# Patient Record
Sex: Female | Born: 1965 | Race: Black or African American | Hispanic: No | Smoking: Never smoker
Health system: Southern US, Community
[De-identification: ages and names within clinical notes are randomized; demographics above are authoritative.]

## PROBLEM LIST (undated history)

## (undated) DIAGNOSIS — I10 Essential (primary) hypertension: Secondary | ICD-10-CM

## (undated) DIAGNOSIS — E1159 Type 2 diabetes mellitus with other circulatory complications: Secondary | ICD-10-CM

## (undated) DIAGNOSIS — K219 Gastro-esophageal reflux disease without esophagitis: Secondary | ICD-10-CM

## (undated) DIAGNOSIS — R059 Cough, unspecified: Secondary | ICD-10-CM

## (undated) DIAGNOSIS — R05 Cough: Secondary | ICD-10-CM

## (undated) DIAGNOSIS — E119 Type 2 diabetes mellitus without complications: Secondary | ICD-10-CM

## (undated) DIAGNOSIS — E1140 Type 2 diabetes mellitus with diabetic neuropathy, unspecified: Secondary | ICD-10-CM

## (undated) DIAGNOSIS — R911 Solitary pulmonary nodule: Secondary | ICD-10-CM

## (undated) DIAGNOSIS — J45909 Unspecified asthma, uncomplicated: Secondary | ICD-10-CM

## (undated) HISTORY — DX: Essential (primary) hypertension: I10

## (undated) HISTORY — DX: Unspecified asthma, uncomplicated: J45.909

## (undated) HISTORY — DX: Type 2 diabetes mellitus with other circulatory complications: E11.59

## (undated) HISTORY — DX: Cough: R05

## (undated) HISTORY — DX: Cough, unspecified: R05.9

## (undated) HISTORY — DX: Solitary pulmonary nodule: R91.1

## (undated) HISTORY — DX: Type 2 diabetes mellitus without complications: E11.9

## (undated) HISTORY — DX: Gastro-esophageal reflux disease without esophagitis: K21.9

## (undated) HISTORY — DX: Type 2 diabetes mellitus with diabetic neuropathy, unspecified: E11.40

---

## 2007-03-20 ENCOUNTER — Emergency Department (HOSPITAL_COMMUNITY): Admission: EM | Admit: 2007-03-20 | Discharge: 2007-03-20 | Payer: Self-pay | Admitting: *Deleted

## 2007-06-14 ENCOUNTER — Encounter: Admission: RE | Admit: 2007-06-14 | Discharge: 2007-06-14 | Payer: Self-pay | Admitting: Internal Medicine

## 2008-03-03 ENCOUNTER — Ambulatory Visit (HOSPITAL_COMMUNITY): Admission: RE | Admit: 2008-03-03 | Discharge: 2008-03-03 | Payer: Self-pay | Admitting: Family Medicine

## 2008-03-24 ENCOUNTER — Ambulatory Visit (HOSPITAL_COMMUNITY): Admission: RE | Admit: 2008-03-24 | Discharge: 2008-03-24 | Payer: Self-pay | Admitting: Obstetrics & Gynecology

## 2008-08-05 ENCOUNTER — Ambulatory Visit: Payer: Self-pay | Admitting: Family

## 2008-08-05 ENCOUNTER — Inpatient Hospital Stay (HOSPITAL_COMMUNITY): Admission: RE | Admit: 2008-08-05 | Discharge: 2008-08-05 | Payer: Self-pay | Admitting: Obstetrics & Gynecology

## 2008-08-07 ENCOUNTER — Inpatient Hospital Stay (HOSPITAL_COMMUNITY): Admission: AD | Admit: 2008-08-07 | Discharge: 2008-08-10 | Payer: Self-pay | Admitting: Obstetrics & Gynecology

## 2008-08-07 ENCOUNTER — Ambulatory Visit: Payer: Self-pay | Admitting: Obstetrics & Gynecology

## 2008-08-07 ENCOUNTER — Encounter: Payer: Self-pay | Admitting: Obstetrics & Gynecology

## 2009-11-16 ENCOUNTER — Emergency Department (HOSPITAL_COMMUNITY): Admission: EM | Admit: 2009-11-16 | Discharge: 2009-11-16 | Payer: Self-pay | Admitting: Emergency Medicine

## 2009-11-16 LAB — CONVERTED CEMR LAB
ALT: 17 units/L
AST: 19 units/L
Albumin: 3.9 g/dL
BUN: 9 mg/dL
CO2: 26 meq/L
Calcium: 9.4 mg/dL
Chloride: 100 meq/L
Potassium: 4.3 meq/L
RDW: 13.8 %
Total Bilirubin: 0.5 mg/dL
WBC: 9.3 10*3/uL

## 2009-11-29 ENCOUNTER — Ambulatory Visit: Payer: Self-pay | Admitting: Nurse Practitioner

## 2009-11-29 DIAGNOSIS — M25569 Pain in unspecified knee: Secondary | ICD-10-CM

## 2009-11-29 DIAGNOSIS — I10 Essential (primary) hypertension: Secondary | ICD-10-CM

## 2009-11-29 DIAGNOSIS — K219 Gastro-esophageal reflux disease without esophagitis: Secondary | ICD-10-CM | POA: Insufficient documentation

## 2009-11-29 DIAGNOSIS — J00 Acute nasopharyngitis [common cold]: Secondary | ICD-10-CM | POA: Insufficient documentation

## 2009-12-27 ENCOUNTER — Ambulatory Visit: Payer: Self-pay | Admitting: Nurse Practitioner

## 2010-01-03 ENCOUNTER — Ambulatory Visit (HOSPITAL_COMMUNITY): Admission: RE | Admit: 2010-01-03 | Discharge: 2010-01-03 | Payer: Self-pay | Admitting: Nurse Practitioner

## 2010-01-10 ENCOUNTER — Telehealth (INDEPENDENT_AMBULATORY_CARE_PROVIDER_SITE_OTHER): Payer: Self-pay | Admitting: Nurse Practitioner

## 2010-01-31 ENCOUNTER — Ambulatory Visit: Payer: Self-pay | Admitting: Nurse Practitioner

## 2010-02-03 LAB — CONVERTED CEMR LAB
Albumin: 4.4 g/dL (ref 3.5–5.2)
Alkaline Phosphatase: 57 units/L (ref 39–117)
Calcium: 9 mg/dL (ref 8.4–10.5)
Cholesterol: 177 mg/dL (ref 0–200)
Eosinophils Relative: 2 % (ref 0–5)
HCT: 41.5 % (ref 36.0–46.0)
HDL: 38 mg/dL — ABNORMAL LOW (ref >39)
Hemoglobin: 13.3 g/dL (ref 12.0–15.0)
MCV: 89.6 fL (ref 78.0–100.0)
Monocytes Absolute: 0.3 10*3/uL (ref 0.1–1.0)
Neutrophils Relative %: 42 % — ABNORMAL LOW (ref 43–77)
Potassium: 4.2 meq/L (ref 3.5–5.3)
RBC: 4.63 M/uL (ref 3.87–5.11)
RDW: 13.5 % (ref 11.5–15.5)
Sodium: 136 meq/L (ref 135–145)
Total Bilirubin: 0.6 mg/dL (ref 0.3–1.2)
Total CHOL/HDL Ratio: 4.7
Triglycerides: 131 mg/dL (ref <150)
VLDL: 26 mg/dL (ref 0–40)
WBC: 6.6 10*3/uL (ref 4.0–10.5)

## 2010-03-15 ENCOUNTER — Ambulatory Visit: Payer: Self-pay | Admitting: Nurse Practitioner

## 2010-03-15 DIAGNOSIS — K5900 Constipation, unspecified: Secondary | ICD-10-CM | POA: Insufficient documentation

## 2010-03-15 LAB — CONVERTED CEMR LAB
Blood in Urine, dipstick: NEGATIVE
CO2: 24 meq/L (ref 19–32)
Chloride: 103 meq/L (ref 96–112)
Creatinine, Ser: 0.7 mg/dL (ref 0.40–1.20)
Glucose, Bld: 100 mg/dL — ABNORMAL HIGH (ref 70–99)
KOH Prep: NEGATIVE
Ketones, urine, test strip: NEGATIVE
Nitrite: NEGATIVE
Protein, U semiquant: NEGATIVE
Specific Gravity, Urine: >=1.03
Urobilinogen, UA: 0.2
WBC Urine, dipstick: NEGATIVE
pH: 6

## 2010-03-16 LAB — CONVERTED CEMR LAB: OCCULT 1: NEGATIVE

## 2010-03-18 ENCOUNTER — Encounter (INDEPENDENT_AMBULATORY_CARE_PROVIDER_SITE_OTHER): Payer: Self-pay | Admitting: Nurse Practitioner

## 2010-03-18 LAB — CONVERTED CEMR LAB: Pap Smear: NEGATIVE

## 2010-03-22 ENCOUNTER — Ambulatory Visit (HOSPITAL_COMMUNITY): Admission: RE | Admit: 2010-03-22 | Discharge: 2010-03-22 | Payer: Self-pay | Admitting: Internal Medicine

## 2010-05-09 ENCOUNTER — Telehealth (INDEPENDENT_AMBULATORY_CARE_PROVIDER_SITE_OTHER): Payer: Self-pay | Admitting: *Deleted

## 2010-05-10 ENCOUNTER — Encounter (INDEPENDENT_AMBULATORY_CARE_PROVIDER_SITE_OTHER): Payer: Self-pay | Admitting: Nurse Practitioner

## 2010-06-14 ENCOUNTER — Ambulatory Visit: Payer: Self-pay | Admitting: Nurse Practitioner

## 2010-10-13 ENCOUNTER — Ambulatory Visit: Payer: Self-pay | Admitting: Nurse Practitioner

## 2010-11-06 ENCOUNTER — Encounter: Payer: Self-pay | Admitting: Internal Medicine

## 2010-11-06 ENCOUNTER — Encounter: Payer: Self-pay | Admitting: Family Medicine

## 2010-11-15 NOTE — Progress Notes (Signed)
Summary: Office Visit//DEPRESSION SCREENING  Office Visit//DEPRESSION SCREENING   Imported By: Arta Bruce 04/25/2010 15:00:39  _____________________________________________________________________  External Attachment:    Type:   Image     Comment:   External Document

## 2010-11-15 NOTE — Progress Notes (Signed)
Summary: X-ray results  Phone Note Outgoing Call   Summary of Call: notify pt that knee x-rays were reviewed. she has mild joint space narrowing or arthritis. continue the tramadol as needed for pain Initial call taken by: Lehman Prom FNP,  January 10, 2010 4:31 PM  Follow-up for Phone Call        left message with person that answered the phone to have pt return call to the office. Levon Hedger  January 11, 2010 12:07 PM  spoke with pt's son and he informed pt of result and for pt to continue taking the tramadol. Follow-up by: Levon Hedger,  January 12, 2010 12:53 PM  New Problems: KNEE PAIN [phone])   New Problems: KNEE PAIN (ICD-719.46)

## 2010-11-15 NOTE — Assessment & Plan Note (Signed)
Summary: Complete Physical Exam   Vital Signs:  Patient profile:   45 year old female Menstrual status:  regular LMP:     01/22/2010 Weight:      211 pounds BMI:     37.51 BSA:     1.98 Temp:     97.4 degrees F oral Pulse rate:   90 / minute Pulse rhythm:   regular Resp:     20 per minute BP sitting:   127 / 84  (left arm) Cuff size:   large  Vitals Entered By: Levon Hedger (Mar 15, 2010 9:11 AM) CC: CPP, Hypertension Management, Abdominal Pain Is Patient Diabetic? No Pain Assessment Patient in pain? yes     Location: knee,chest,stomach Onset of pain  Chronic  Does patient need assistance? Functional Status Self care Ambulation Normal LMP (date): 01/22/2010     Enter LMP: 01/22/2010  Immunization History:  MMR Immunization History:    MMR # 1:  historical (09/28/2006)  Tetanus/Td Immunization History:    Tetanus/Td:  historical (04/04/2007)   CC:  CPP, Hypertension Management, and Abdominal Pain.  History of Present Illness:  Pt into the office for a complete physical exam  PAP - no Pap smear since she has been in the [place] which is 2 years ago  Mammogram - no mammogram in the past year  Optho - eye exam was less than 1 year ago and pt did get glasses but pt does not wear because she thinks the medicine in the glasses is too strong  Dental - last exam was 2 years ago upon arrival to the [place]  son present today in office and is interpreting for the pt  Dyspepsia History:      She has no alarm features of dyspepsia including no history of melena, hematochezia, dysphagia, persistent vomiting, or involuntary weight loss > 5%.  There is a prior history of GERD.  The patient does not have a prior history of documented ulcer disease.  The dominant symptom is heartburn or acid reflux.  An H-2 blocker medication is not currently being taken.  She has a history of a positive H. Pylori serology.  No previous upper endoscopy has been done.    Hypertension History:     She denies headache, chest pain, and palpitations.  She notes no problems with any antihypertensive medication side effects.        Positive major cardiovascular risk factors include hypertension.  Negative major cardiovascular risk factors include female age less than 26 years old, no history of diabetes or hyperlipidemia, and non-tobacco-user status.        Further assessment for target organ damage reveals no history of ASHD, cardiac end-organ damage (CHF/LVH), stroke/TIA, peripheral vascular disease, renal insufficiency, or hypertensive retinopathy.    Habits & Providers  Alcohol-Tobacco-Diet     Alcohol drinks/day: 0     Tobacco Status: never  Exercise-Depression-Behavior     Does Patient Exercise: no     Depression Counseling: not indicated; screening negative for depression     Drug Use: never  Medications Prior to Update: 1)  Omeprazole 20 Mg Cpdr (Omeprazole) .... One Tablet By Mouth Two Times A Day Before Breakfast and Before Dinner 2)  Tramadol Hcl 50 Mg Tabs (Tramadol Hcl) .... One Tablet By Mouth Two Times A Day As Needed For Pain 3)  Lisinopril 10 Mg Tabs (Lisinopril) .... One Tablet By Mouth Daily For Blood Pressure 4)  Amoxicillin 500 Mg Caps (Amoxicillin) .... 2 Capsules By Mouth Two Times  A Day For Infection  Allergies (verified): 1)  ! Bactrim  Review of Systems General:  Denies fever. Eyes:  Denies blurring. ENT:  Denies earache. CV:  Denies chest pain or discomfort. Resp:  Denies cough. GI:  Complains of constipation; denies abdominal pain, nausea, and vomiting; pt has finished medications ordered for H.pylori. Abd symptoms have improved. GU:  Denies discharge. MS:  Complains of joint pain; bil knee pain. Derm:  Denies rash. Neuro:  Denies headaches. Psych:  Denies anxiety and depression.  Physical Exam  General:  alert.   Head:  normocephalic.   Eyes:  pupils round.   Ears:  R ear normal and L ear normal.  bil TM visible with minimal cerumen Nose:   no nasal discharge.   Mouth:  pharynx pink and moist.   Neck:  supple.   Chest Wall:  no mass.   Breasts:  no abnormal thickening.   Lungs:  normal breath sounds.   Heart:  normal rate and regular rhythm.   Abdomen:  soft, non-tender, and normal bowel sounds.   Rectal:  no external abnormalities.   Msk:  normal ROM.   Pulses:  R radial normal and L radial normal.   Extremities:  no edema Neurologic:  alert & oriented X3 and gait normal.   Skin:  color normal.   Psych:  Oriented X3.    Pelvic Exam  Vulva:      normal appearance.   Urethra and Bladder:      Urethra--no discharge.   Vagina:      physiologic discharge.   Cervix:      unable to visualize Uterus:      smooth.   Adnexa:      nontender bilaterally.   Rectum:      normal, heme negative stool.      Impression & Recommendations:  Problem # 1:  ROUTINE GYNECOLOGICAL EXAMINATION (ICD-V72.31) labs reviewed from previous visit guaiac negative PAP done tdap up to date rec optho and dental exam Orders: Hemoccult Guaiac-1 spec.(in office) (82270) KOH/ WET Mount [phone]) Pap Smear, Thin Prep ( Collection of) (Q0091) T- GC Chlamydia (29562)  Problem # 2:  UNSPECIFIED BREAST SCREENING (ICD-V76.10) discussed self breast exam Orders: Mammogram (Screening) (Mammo)  Problem # 3:  HYPERTENSION, BENIGN ESSENTIAL (ICD-401.1) stable reviewed DASH diet Her updated medication list for this problem includes:    Lisinopril 10 Mg Tabs (Lisinopril) ..... One tablet by mouth daily for blood pressure  Orders: UA Dipstick w/o Micro (manual) (13086) EKG w/ Interpretation (93000) T-Urine Microalbumin w/creat. ratio ([phone]) T-Basic Metabolic Panel (32440-10272)  Problem # 4:  GASTROESOPHAGEAL REFLUX DISEASE (ICD-530.81)  Her updated medication list for this problem includes:    Omeprazole 20 Mg Cpdr (Omeprazole) ..... One tablet by mouth two times a day before breakfast and before dinner  Problem # 5:   CONSTIPATION (ICD-564.00) advised pt add fiber to her diet may start a stool softner  Complete Medication List: 1)  Omeprazole 20 Mg Cpdr (Omeprazole) .... One tablet by mouth two times a day before breakfast and before dinner 2)  Tramadol Hcl 50 Mg Tabs (Tramadol hcl) .... One tablet by mouth two times a day as needed for pain 3)  Lisinopril 10 Mg Tabs (Lisinopril) .... One tablet by mouth daily for blood pressure  Dyspepsia Assessment/Plan:  Step Therapy: GERD Treatment Protocols:    Step-1: failed    Step-2: started  Hypertension Assessment/Plan:      The patient's hypertensive risk group is category A:  No risk factors and no target organ damage.  Her calculated 10 year risk of coronary heart disease is 6 %.  Today's blood pressure is 127/84.  Her blood pressure goal is < 140/90.    Patient Instructions: 1)  Follow up in 3 months for high blood pressure 2)  Colace 100mg  by mouth two times a day to help soften stools 3)  You will be informed of any abnormal results Prescriptions: TRAMADOL HCL 50 MG TABS (TRAMADOL HCL) One tablet by mouth two times a day as needed for pain  #50 x 1   Entered and Authorized by:   Lehman Prom FNP   Signed by:   Lehman Prom FNP on 03/15/2010   Method used:   Print then Give to Patient   RxID:   [id number]   Laboratory Results   Urine Tests  Date/Time Received: Mar 15, 2010 9:41 AM   Routine Urinalysis   Color: lt. yellow Appearance: Clear Glucose: negative   (Normal Range: Negative) Bilirubin: negative   (Normal Range: Negative) Ketone: negative   (Normal Range: Negative) Spec. Gravity: >=1.030   (Normal Range: 1.003-1.035) Blood: negative   (Normal Range: Negative) pH: 6.0   (Normal Range: 5.0-8.0) Protein: negative   (Normal Range: Negative) Urobilinogen: 0.2   (Normal Range: 0-1) Nitrite: negative   (Normal Range: Negative) Leukocyte Esterace: negative   (Normal Range: Negative)    Date/Time Received: March 16, 2010 4:44 PM   Wet Mount/KOH Source: vaginal  WBC/hpf: 1-5 Bacteria/hpf: 1+ Clue cells/hpf: none Yeast/hpf: none Trichomonas/hpf: none  Stool - Occult Blood Hemmoccult #1: negative Date: 03/16/2010    Laboratory Results   Urine Tests    Routine Urinalysis   Color: lt. yellow Appearance: Clear Glucose: negative   (Normal Range: Negative) Bilirubin: negative   (Normal Range: Negative) Ketone: negative   (Normal Range: Negative) Spec. Gravity: >=1.030   (Normal Range: 1.003-1.035) Blood: negative   (Normal Range: Negative) pH: 6.0   (Normal Range: 5.0-8.0) Protein: negative   (Normal Range: Negative) Urobilinogen: 0.2   (Normal Range: 0-1) Nitrite: negative   (Normal Range: Negative) Leukocyte Esterace: negative   (Normal Range: Negative)      Wet Mount Wet Mount KOH: Negative    Prevention & Chronic Care Immunizations   Influenza vaccine: Not documented    Tetanus booster: 04/04/2007: Historical    Pneumococcal vaccine: Not documented  Other Screening   Pap smear: Not documented    Mammogram: Not documented   Smoking status: never  (03/15/2010)  Lipids   Total Cholesterol: 177  (01/31/2010)   LDL: 113  (01/31/2010)   LDL Direct: Not documented   HDL: 38  (01/31/2010)   Triglycerides: 131  (01/31/2010)  Hypertension   Last Blood Pressure: 127 / 84  (03/15/2010)   Serum creatinine: 0.79  (01/31/2010)   Serum potassium 4.2  (01/31/2010)  Self-Management Support :    Hypertension self-management support: Not documented

## 2010-11-15 NOTE — Assessment & Plan Note (Signed)
Summary: HTN/Knee pain   Vital Signs:  Patient profile:   45 year old female Menstrual status:  regular LMP:     05/29/2010 Height:      63 inches Weight:      207 pounds BMI:     36.80 Temp:     97.9 degrees F oral Pulse rate:   88 / minute Pulse rhythm:   regular Resp:     18 per minute BP sitting:   122 / 80  (left arm) Cuff size:   large  Vitals Entered By: [place] Shannon (June 14, 2010 8:48 AM)  Nutrition Counseling: Patient's BMI is greater than 25 and therefore counseled on weight management options. CC: 3 month follow-up...pt is having problem in her legs, knees, and head , Hypertension Management Is Patient Diabetic? No Pain Assessment Patient in pain? no       Does patient need assistance? Functional Status Self care Ambulation Normal LMP (date): 05/29/2010     Enter LMP: 05/29/2010 Last PAP Result  Specimen Adequacy: Satisfactory for evaluation.   Interpretation/Result:Negative for intraepithelial Lesion or Malignancy.      CC:  3 month follow-up...pt is having problem in her legs, knees, and head , and Hypertension Management.  History of Present Illness:  Pt into the office for f/u on blood pressure.  Pt does not have her medication with her into the office today. 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  Language line used - Swahili **Pt has also been [facility] which is the office listed on her medicaid card**  Hypertension History:      She denies headache, chest pain, and palpitations.  She notes no problems with any antihypertensive medication side effects.  completed supply of blood pressure medications 2 days ago and presents today for a refill.        Positive major cardiovascular risk factors include hypertension.  Negative major cardiovascular risk factors include female age less than 73 years old, no history  of diabetes or hyperlipidemia, and non-tobacco-user status.        Further assessment for target organ damage reveals no history of ASHD, cardiac end-organ damage (CHF/LVH), stroke/TIA, peripheral vascular disease, renal insufficiency, or hypertensive retinopathy.     Current Medications (verified): 1)  Omeprazole 20 Mg Cpdr (Omeprazole) .... One Tablet By Mouth Two Times A Day Before Breakfast and Before Dinner 2)  Tramadol Hcl 50 Mg Tabs (Tramadol Hcl) .... One Tablet By Mouth Two Times A Day As Needed For Pain 3)  Lisinopril 10 Mg Tabs (Lisinopril) .... One Tablet By Mouth Daily For Blood Pressure  Allergies (verified): 1)  ! Bactrim  Review of Systems Eyes:  Complains of blurring; last eye exam was 2 years ago.  she has glasses but when she puts them on her vision is blurred and she gets dizzy.. CV:  Denies chest pain or discomfort. Resp:  Denies cough. GI:  Complains of constipation and indigestion; denies abdominal pain, nausea, and vomiting; still with constipation at times.  . MS:  Complains of joint pain; knee pain - x-rays done earlier this year. pain worse upon waking in the morning.  gets some better during the day but then sometimes at night the pain gets worse.  Physical Exam  General:  alert.   Head:  normocephalic.   Lungs:  normal breath sounds.   Heart:  normal rate and regular rhythm.   Abdomen:  non-tender.   Msk:  up to the exam table Neurologic:  alert & oriented X3.     Knee Exam  General:    obese.    Knee Exam:    Right:    Inspection:  Abnormal    Palpation:  Normal    Stability:  stable    Tenderness:  no    Swelling:  diffuse    Erythema:  no    arthritic changes    Left:    Inspection:  Abnormal    Palpation:  Normal    Stability:  stable    Tenderness:  no    Swelling:  diffuse    Erythema:  no    arthritic changes   Impression & Recommendations:  Problem # 1:  HYPERTENSION, BENIGN ESSENTIAL (ICD-401.1) BP stable will restart  medications DASH diet reviewed with pt Her updated medication list for this problem includes:    Lisinopril-hydrochlorothiazide 10-12.5 Mg Tabs (Lisinopril-hydrochlorothiazide) ..... One tablet by mouth daily for blood pressure  Problem # 2:  KNEE PAIN (ICD-719.46) x-rays done earlier this year Her updated medication list for this problem includes:    Tramadol Hcl 50 Mg Tabs (Tramadol hcl) ..... One tablet by mouth two times a day as needed for pain    Feldene 20 Mg Caps (Piroxicam) ..... One tablet by mouth daily for joints  Problem # 3:  GASTROESOPHAGEAL REFLUX DISEASE (ICD-530.81) symptoms improved will start anti-inflammatory but will monitor as this may increase GI problems Her updated medication list for this problem includes:    Nexium 40 Mg Cpdr (Esomeprazole magnesium) ..... One tablet by mouth daily for stomach  Problem # 4:  CONSTIPATION (ICD-564.00) continued to encourage pt to add more fiber to her diet  fiber one samples given  Complete Medication List: 1)  Nexium 40 Mg Cpdr (Esomeprazole magnesium) .... One tablet by mouth daily for stomach 2)  Tramadol Hcl 50 Mg Tabs (Tramadol hcl) .... One tablet by mouth two times a day as needed for pain 3)  Lisinopril-hydrochlorothiazide 10-12.5 Mg Tabs (Lisinopril-hydrochlorothiazide) .... One tablet by mouth daily for blood pressure 4)  Feldene 20 Mg Caps (Piroxicam) .... One tablet by mouth daily for joints  Hypertension Assessment/Plan:      The patient's hypertensive risk group is category A: No risk factors and no target organ damage.  Her calculated 10 year risk of coronary heart disease is 6 %.  Today's blood pressure is 122/80.  Her blood pressure goal is < 140/90.  Patient Instructions: 1)  Follow up in this office in December 2011 or sooner if necessary with n.martin,fnp for high blood pressure and leg pain. 2)  Flu vaccine will be due then Prescriptions: NEXIUM 40 MG CPDR (ESOMEPRAZOLE MAGNESIUM) One tablet by mouth  daily for stomach  #30 x 5   Entered and Authorized by:   Lehman Prom FNP   Signed by:   Lehman Prom FNP on 06/14/2010   Method used:   Print then Give to Patient   RxID:   [id number] TRAMADOL HCL 50 MG TABS (TRAMADOL HCL) One tablet by mouth two times a day as needed for pain  #50 x 1   Entered and Authorized by:   Lehman Prom FNP   Signed by:   Lehman Prom FNP on 06/14/2010   Method used:   Print then Give to Patient   RxID:   [id number] FELDENE 20 MG CAPS (PIROXICAM) One tablet by mouth  daily for joints  #30 x 5   Entered and Authorized by:   Lehman Prom FNP   Signed by:   Lehman Prom FNP on 06/14/2010   Method used:   Print then Give to Patient   RxID:   [id number] LISINOPRIL-HYDROCHLOROTHIAZIDE 10-12.5 MG TABS (LISINOPRIL-HYDROCHLOROTHIAZIDE) One tablet by mouth daily for blood pressure  #30 x 5   Entered and Authorized by:   Lehman Prom FNP   Signed by:   Lehman Prom FNP on 06/14/2010   Method used:   Print then Give to Patient   RxID:   [id number]

## 2010-11-15 NOTE — Letter (Signed)
Summary: *HSN Results Follow up  [facility]  [address] [place], [place] [address]   Phone: [phone]  Fax: [phone]      03/18/2010   KAMESHIA MADRUGA [address] [place], [place]  [address]  [place]   Dear  Ms. Lamari SINZINKAYO,                            ____S.Drinkard,FNP   ____D. Gore,FNP       ____B. McPherson,MD   ____V. Rankins,MD    ____E. Mulberry,MD    __X__N. Daphine Deutscher, FNP  ____D. Reche Dixon, MD    ____K. Philipp Deputy, MD    ____Other     This letter is to inform you that your recent test(s):  ___X____Pap Smear    ___X____Lab Test     _______X-ray    ___X____ is within acceptable limits  _______ requires a medication change  _______ requires a follow-up lab visit  _______ requires a follow-up visit with your provider   Comments: Labs and pap smear are normal.       _________________________________________________________ If you have any questions, please contact our office [phone].                    Sincerely,    Lehman Prom FNP [facility]

## 2010-11-15 NOTE — Assessment & Plan Note (Signed)
Summary: F/u x-rays & GERD   Vital Signs:  Patient profile:   45 year old female Menstrual status:  regular Weight:      211.7 pounds BMI:     37.64 BSA:     1.98 Temp:     97.5 degrees F oral Pulse rate:   85 / minute Pulse rhythm:   regular Resp:     20 per minute BP sitting:   142 / 113  (left arm) Cuff size:   regular  Vitals Entered By: Levon Hedger (January 31, 2010 9:30 AM) CC: Abdominal Pain, Hypertension Management  Does patient need assistance? Functional Status Self care Ambulation Normal   CC:  Abdominal Pain and Hypertension Management.  History of Present Illness:  Pt into the office for 1 month f/u on abdominal pain  1.  Abdominal pain - pt was started on omeprazole during the previous visit. She was also educated on what foods to avoid.  Pt admits that omeprazole is doing better to improve symptoms but she completed her supply of medications on yesterday.  She presents today with the medication bottle and she has 3 refills on the medications  2.  Knee pain - x-rays done following the last visit. Findings - Mild medial joint compartment narrowing and osteophytosis. pt was stared on tramadol and is taking as needed   3.  Elevated blood pressure - elevated on previous visits but no meds given She is already monitoring sodium in her diet  CPE - last done during pregnancy.  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  The patient does not have a prior history of documented ulcer disease.  The dominant symptom is heartburn or acid reflux.  An H-2 blocker medication is not currently being taken.  No previous upper endoscopy has been done.    Hypertension History:  She denies headache, chest pain, and palpitations.  No current medications.        Positive major cardiovascular risk factors include hypertension.  Negative major cardiovascular risk factors include female age less than 53 years old and non-tobacco-user status.      Habits & Providers  Alcohol-Tobacco-Diet     Alcohol drinks/day: 0     Tobacco Status: never  Exercise-Depression-Behavior     Does Patient Exercise: no     Have you felt down or hopeless? no     Have you felt little pleasure in things? no     Depression Counseling: not indicated; screening negative for depression     Drug Use: never  Allergies: 1)  ! Bactrim  Social History: Does Patient Exercise:  no  Review of Systems CV:  Denies chest pain or discomfort. Resp:  Denies cough. GI:  Denies abdominal pain, nausea, and vomiting. MS:  Complains of joint pain; bil knee pain.  Physical Exam  General:  alert.   Head:  normocephalic.   Lungs:  normal breath sounds.   Heart:  normal rate and regular rhythm.   Msk:  normal ROM.   Neurologic:  alert & oriented X3.   Skin:  color normal.   Psych:  Oriented X3.  Impression & Recommendations:  Problem # 1:  GASTROESOPHAGEAL REFLUX DISEASE (ICD-530.81)  symptoms improved with medications and diet  will check h.pylori today Her updated medication list for this problem includes:    Omeprazole 20 Mg Cpdr (Omeprazole) ..... One tablet by mouth two times a day before breakfast and before dinner  Orders: T-H Pylori Antibody IgM ([id number])  Problem # 2:  KNEE PAIN (ICD-719.46) x-ray results reviewed with pt from previous visit pt also advised to increase her exercise as a means to lose weight  note written for pt - advised her that current symtpoms should NOT limit her from going to school Her updated medication list for this problem includes:    Tramadol Hcl 50 Mg Tabs (Tramadol hcl) ..... One tablet by mouth two times a day as needed for pain  Problem #  3:  HYPERTENSION, BENIGN ESSENTIAL (ICD-401.1)  BP is still elevated. will need to start medications Her updated medication list for this problem includes:    Lisinopril 10 Mg Tabs (Lisinopril) ..... One tablet by mouth daily for blood pressure  Orders: T-Lipid Profile ([id number]) T-Comprehensive Metabolic Panel [phone]) T-CBC w/Diff ([id number]) T-TSH (28413-24401) Rapid HIV  (02725)  Complete Medication List: 1)  Omeprazole 20 Mg Cpdr (Omeprazole) .... One tablet by mouth two times a day before breakfast and before dinner 2)  Tramadol Hcl 50 Mg Tabs (Tramadol hcl) .... One tablet by mouth two times a day as needed for pain 3)  Lisinopril 10 Mg Tabs (Lisinopril) .... One tablet by mouth daily for blood pressure  Dyspepsia Assessment/Plan:  Step Therapy: GERD Treatment Protocols:    Step-1: failed    Step-2: started  Hypertension Assessment/Plan:      The patient's hypertensive risk group is category A: No risk factors and no target organ damage.  Today's blood pressure is 142/113.  Her blood pressure goal is < 140/90.  Patient Instructions: 1)  Schedule an appointment in 4-6 weeks for a complete physical exam. 2)  will need bmp. 3)  You will need PAP, Mammogram, EKG, u/a and  4)  Blood pressure - elevated today.  you will need to start blood pressure medication - lisinopril 10mg  by mouth daily Prescriptions: TRAMADOL HCL 50 MG TABS (TRAMADOL HCL) One tablet by mouth two times a day as needed for pain  #50 x 0   Entered and Authorized by:   Lehman Prom FNP   Signed by:   Lehman Prom FNP on 01/31/2010   Method used:   Print then Give to Patient   RxID:   [id number] LISINOPRIL 10 MG TABS (LISINOPRIL) One tablet by mouth daily for blood pressure  #30 x 3   Entered and Authorized by:   Lehman Prom FNP   Signed by:   Lehman Prom FNP on 01/31/2010   Method used:   Print then Give to Patient   RxID:   [id number]   Laboratory Results   Date/Time Received: January 31, 2010   Other Tests  Rapid HIV: negative

## 2010-11-15 NOTE — Assessment & Plan Note (Signed)
Summary: NEW - Establish care   Vital Signs:  Patient profile:   45 year old female LMP:     11/22/2009 Height:      63 inches Weight:      212.3 pounds BMI:     37.74 BSA:     1.99 Temp:     97.7 degrees F oral Pulse rate:   77 / minute Pulse rhythm:   regular Resp:     20 per minute BP sitting:   137 / 90  (left arm) Cuff size:   regular  Vitals Entered By: Levon Hedger (November 29, 2009 3:43 PM) CC: new establish/ [facility] urgent care, Abdominal Pain Is Patient Diabetic? No Pain Assessment Patient in pain? yes     Location: head, back,leg Intensity: 8  Does patient need assistance? Functional Status Self care Ambulation Normal LMP (date): 11/22/2009     Enter LMP: 11/22/2009   CC:  new establish/ [facility] urgent care and Abdominal Pain.  History of Present Illness:  Pt into the office to establish care.  Pt was previously seen at [facility] but no visit since 07/2009 No PMH PSH - only C-section  Pt was admitted to ER on 11/16/2009 with back pain. (records reviewed today) U/A negative Abd x-ray done - normal CMP and CBC normal it appears that pt was treated with meloxicam and tramadol for back pain (which as resolved).  She has taken the meds as ordered  GERD - previously dx with GERD and was previously prescribed nexium but pt was not able to return to that provider +abd pain -tobacco  -ETOH -spicy foods -soda, coffee, tea +tomato sauce -chocolate  Social - Pt speaks Swaheli/Ugandi, interpreter present today with pt Last CPE done 1.5 years ago with birth of her last child IN [place] for 3 years; originally from [place]  Dyspepsia History:      She has no alarm features of dyspepsia including no history of melena, hematochezia, dysphagia, persistent vomiting, or involuntary weight loss > 5%.  There is no prior history of GERD.  The patient does not have a prior history of documented ulcer disease.  The dominant symptom is heartburn or acid reflux.  An  H-2 blocker medication is currently being taken.  She has no history of a positive H. Pylori serology.  No previous upper endoscopy has been done.    Habits & Providers  Alcohol-Tobacco-Diet     Alcohol drinks/day: 0     Tobacco Status: never  Exercise-Depression-Behavior     Have you felt down or hopeless? no     Have you felt little pleasure in things? no     Drug Use: never  Allergies (verified): 1)  ! Bactrim  Past History:  Past Surgical History: Caesarean section x 1 Tubal ligation  Social History: Social - 8 children Denies any tobacco, ETOG, or drug [place]:  [place] Drug Use:  never  Review of Systems General:  Denies fever. ENT:  Complains of nasal congestion; denies earache and sore throat. CV:  Denies chest pain or discomfort. Resp:  Complains of shortness of breath; denies cough and wheezing. GI:  Complains of constipation and indigestion. MS:  Complains of joint pain; right knee swelling - intermittently.previously dx with arthritis .  Physical Exam  General:  alert.   Head:  normocephalic.   Eyes:  pupils round.   Ears:  R ear normal and L ear normal.   Lungs:  normal breath sounds.  Heart:  normal rate and regular rhythm.   Abdomen:  normal bowel sounds.   midepigastric tenderness Msk:  up to the exam table Neurologic:  alert & oriented X3.   Skin:  color normal.   Psych:  Oriented X3.     Knee Exam  Knee Exam:    Right:    Inspection:  Abnormal    Swelling:  diffuse    crepitus with flexion arthritic changes    Left:    Inspection:  Abnormal   Impression & Recommendations:  Problem # 1:  GASTROESOPHAGEAL REFLUX DISEASE (ICD-530.81)  Reviewed causes with pt she will need to modify her diet advised pt not to take the remaining anti-inflammatories due to abdominal pain  Her updated medication list for this problem includes:    Famotidine 20 Mg Tabs (Famotidine) ..... One tablet by mouth two  times a day for stomach  Problem # 2:  NASOPHARYNGITIS, ACUTE (ICD-460)  advised good hygiene  Her updated medication list for this problem includes:    Ceron-dm 12.02-16-14 Mg/57ml Syrp (Phenylephrine-chlorphen-dm) .Marland Kitchen..Marland Kitchen Two teaspoons every 6 hours as needed for cough/congestion  Problem # 3:  KNEE PAIN (ICD-719.46) most likely arthritic educated pt on arthritis would give anti-inflammatory ideally but given GERD flare will have pt get tylenol arthritis insteat  Problem # 4:  ELEVATED BLOOD PRESSURE WITHOUT DIAGNOSIS OF HYPERTENSION (ICD-796.2) DASH diet advised pt to monitor diet and try to lose weight  Complete Medication List: 1)  Ceron-dm 12.02-16-14 Mg/81ml Syrp (Phenylephrine-chlorphen-dm) .... Two teaspoons every 6 hours as needed for cough/congestion 2)  Famotidine 20 Mg Tabs (Famotidine) .... One tablet by mouth two times a day for stomach   Patient Instructions: 1)  Avoid triggers for your stomach such as red tomato sauce, onions, fried foods. 2)  Knee  pain - may take tylenol arthritis (over the counter) for knee pain 3)  Follow up in 4 weeks for stomach Prescriptions: FAMOTIDINE 20 MG TABS (FAMOTIDINE) One tablet by mouth two times a day for stomach  #60 x 3   Entered and Authorized by:   Lehman Prom FNP   Signed by:   Lehman Prom FNP on 11/29/2009   Method used:   Print then Give to Patient   RxID:   [id number] CERON-DM 12.02-16-14 MG/5ML SYRP (PHENYLEPHRINE-CHLORPHEN-DM) Two teaspoons every 6 hours as needed for cough/congestion  #149ml x 0   Entered and Authorized by:   Lehman Prom FNP   Signed by:   Lehman Prom FNP on 11/29/2009   Method used:   Print then Give to Patient   RxID:   [id number]    X-ray  Procedure date:  11/16/2009  Findings:      abdominal films - nonobstruced bowel gas patter, no free air no acute cardiopulmonary abnormality

## 2010-11-15 NOTE — Assessment & Plan Note (Signed)
Summary: GERD/Knee pain   Vital Signs:  Patient profile:   45 year old female Menstrual status:  regular LMP:     12/25/2009 Height:      63 inches Weight:      212.5 pounds BMI:     37.78 Temp:     97.6 degrees F oral Pulse rate:   74 / minute Pulse rhythm:   regular Resp:     18 per minute BP sitting:   131 / 85  (left arm) Cuff size:   regular  Vitals Entered By: Arthor Captain (December 27, 2009 9:35 AM) CC: F/U stomach pain, Abdominal Pain Pain Assessment Patient in pain? yes     Location: stomach and legs Intensity: 10 Type: sharp Onset of pain  worse sith eating  Does patient need assistance? Functional Status Self care Ambulation Normal Comments Pt. also complains of leg pain LMP (date): 12/25/2009     Menstrual Status regular Enter LMP: 12/25/2009   CC:  F/U stomach pain and Abdominal Pain.  History of Present Illness:  Pt into the office for 1 month f/u on stomach. She was started on pepcid during the last visit - she took medication only once per day instead of twice per day.  However when looking at her medication bottle she was dispensed 60 tablet on 11/30/2009.  Pt reports that she still has some of the medication she just took them out of the original container and she still has some of those medications at home. Pt has avoided tomatos, onions, orange juice (recites) as instructed since the last visit  Interpreter present during the exam  Dyspepsia History:      She has no alarm features of dyspepsia including no history of melena, hematochezia, dysphagia, persistent vomiting, or involuntary weight loss > 5%.  There is a prior history of GERD.  The patient does not have a prior history of documented ulcer disease.  The dominant symptom is heartburn or acid reflux.  An H-2 blocker medication is currently being taken.  She notes that the symptoms have not improved with the H-2 blocker therapy.  Symptoms have persisted after 4 weeks of H-2 blocker treatment.   No previous upper endoscopy has been done.     Habits & Providers  Alcohol-Tobacco-Diet     Alcohol drinks/day: 0     Tobacco Status: never  Exercise-Depression-Behavior     Have you felt down or hopeless? no     Have you felt little pleasure in things? no     Depression Counseling: not indicated; screening negative for depression     Drug Use: never  Allergies (verified): 1)  ! Bactrim  Review of Systems General:  Denies fever. CV:  Denies chest pain or discomfort. Resp:  Denies cough; cold and congestion symptoms as reported on last visit have resolved. GI:  Complains of abdominal pain and indigestion. MS:  Complains of joint pain; knee pain.  Physical Exam  General:  alert.   Head:  normocephalic.   Lungs:  normal breath sounds.   Heart:  normal rate and regular rhythm.   Abdomen:  midepigastric tenderness Msk:  up to the exam table Neurologic:  alert & oriented X3.   Skin:  color normal.   Psych:  Oriented X3.     Knee Exam  General:    obese.    Knee Exam:    Right:    Inspection:  Abnormal    Palpation:  Normal    Stability:  stable  Tenderness:  no    Swelling:  no    Erythema:  no    Left:    Inspection:  Abnormal    Palpation:  Normal    Stability:  stable    Tenderness:  no    Swelling:  no    Erythema:  no    arthritic changes   Impression & Recommendations:  Problem # 1:  GASTROESOPHAGEAL REFLUX DISEASE (ICD-530.81)  still avoid irritating foods as previously  will start omeprazole  The following medications were removed from the medication list:    Famotidine 20 Mg Tabs (Famotidine) ..... One tablet by mouth two times a day for stomach Her updated medication list for this problem includes:    Omeprazole 20 Mg Cpdr (Omeprazole) ..... One tablet by mouth two times a day before breakfast and before dinner  Problem # 2:  KNEE PAIN (ICD-719.46)  will order knee x-rays likely arthritic Her updated medication list for this problem  includes:    Tramadol Hcl 50 Mg Tabs (Tramadol hcl) ..... One tablet by mouth two times a day as needed for pain  Orders: Radiology other (Radiology Other)  Problem # 3:  ELEVATED BLOOD PRESSURE WITHOUT DIAGNOSIS OF HYPERTENSION (ICD-796.2) still monitor  Complete Medication List: 1)  Omeprazole 20 Mg Cpdr (Omeprazole) .... One tablet by mouth two times a day before breakfast and before dinner 2)  Tramadol Hcl 50 Mg Tabs (Tramadol hcl) .... One tablet by mouth two times a day as needed for pain  Dyspepsia Assessment/Plan:  Step Therapy: GERD Treatment Protocols:    Step-1: failed    H-2 blocker chosen: Famotidine 20mg  by mouth at bedtime    Step-2: started  Patient Instructions: 1)  Knee pain - get x-ray of right knees. Take tramadol as needed for pain. 2)  Stomach - take omeprazole two times a day for stomach 3)  Follow up in 1 month for stomach.  Will review x-ray results. Prescriptions: TRAMADOL HCL 50 MG TABS (TRAMADOL HCL) One tablet by mouth two times a day as needed for pain  #50 x 0   Entered and Authorized by:   Lehman Prom FNP   Signed by:   Lehman Prom FNP on 12/27/2009   Method used:   Print then Give to Patient   RxID:   [id number] OMEPRAZOLE 20 MG CPDR (OMEPRAZOLE) One tablet by mouth two times a day before breakfast and before dinner  #60 x 3   Entered and Authorized by:   Lehman Prom FNP   Signed by:   Lehman Prom FNP on 12/27/2009   Method used:   Print then Give to Patient   RxID:   [id number]

## 2010-11-15 NOTE — Progress Notes (Signed)
Summary: REFILLS  Phone Note Call from Patient Call back at Home Phone [phone]   Caller: [facility] Reason for Call: Refill Medication Summary of Call: [name], MS Jenny Nelson SAYS THAT SHE NEEDS REFILL ON HER OMEPRAZOLE AND LISINOPRIL CALLED INTO [facility]. Initial call taken by: Leodis Rains,  May 09, 2010 10:10 AM  Follow-up for Phone Call        [name] ALSO HAD A PAPER FAXED OVER FROM [employer] FOR WORK [employer]UDY TO BE FILLED OUT & FAXED BACK. THE PAPER IS IN YOUR REFILL SLOTS Follow-up by: Leodis Rains,  May 09, 2010 4:37 PM  Additional Follow-up for Phone Call Additional follow up Details #1::        notify pt that refills sent electronically to [facility] pharmacy form completed and in bottom shelf of my office. fax back as indicated Additional Follow-up by: Lehman Prom FNP,  May 10, 2010 8:55 AM    Additional Follow-up for Phone Call Additional follow up Details #2::    CALLED PT AND LEFT MESSAGE Follow-up by: Arta Bruce,  May 10, 2010 9:31 AM  Prescriptions: OMEPRAZOLE 20 MG CPDR (OMEPRAZOLE) One tablet by mouth two times a day before breakfast and before dinner  #60 x 5   Entered and Authorized by:   Lehman Prom FNP   Signed by:   Lehman Prom FNP on 05/10/2010   Method used:   Faxed to ...       [facility] (retail)       [address] [place], [place]  [address]       Ph: [phone]       Fax: [phone]   RxID:   [id number] LISINOPRIL 10 MG TABS (LISINOPRIL) One tablet by mouth daily for blood pressure  #30 x 5   Entered and Authorized by:   Lehman Prom FNP   Signed by:   Lehman Prom FNP on 05/10/2010   Method used:   Faxed to ...       [facility] (retail)       [address] [place], [place]  [address]       Ph: [phone]       Fax: [phone]   RxID:   [id number]

## 2010-11-15 NOTE — Letter (Signed)
Summary: *Referral Letter  [facility]  [address] [place], [place] [address]   Phone: [phone]  Fax: [phone]    01/31/2010  Joelene Barriere [address] [place], [place]  [address]  Phone: [phone]  To whom it may concern,  Ms. Jenny Nelson has been seen in this office for the past 6 weeks for knee problems.  She has been evaluated and sent for additions testing regarding her knees.  The findings indicate that she does have some mild limitations but is able to continue with school as scheduled at this time.  Current Medical Problems: 1)  HYPERTENSION, BENIGN ESSENTIAL (ICD-401.1) 2)  KNEE PAIN (ICD-719.46) 3)  GASTROESOPHAGEAL REFLUX DISEASE (ICD-530.81)   Current Medications: 1)  OMEPRAZOLE 20 MG CPDR (OMEPRAZOLE) One tablet by mouth two times a day before breakfast and before dinner 2)  TRAMADOL HCL 50 MG TABS (TRAMADOL HCL) One tablet by mouth two times a day as needed for pain 3)  LISINOPRIL 10 MG TABS (LISINOPRIL) One tablet by mouth daily for blood pressure  Please contact us if you have any further questions or need additional information.  Sincerely,    Lehman Prom FNP [facility]

## 2010-11-15 NOTE — Letter (Signed)
Summary: Work Excuse  [facility]  [address] [place], [place] [address]   Phone: [phone]  Fax: [phone]    Today's Date: June 14, 2010  Name of Patient: Jenny Nelson  The above named patient had a medical visit today   Please take this into consideration when reviewing the time away from school.    Special Instructions:  [  ] None  [ X ] To be off the remainder of today, returning to the normal school schedule tomorrow.  [  ] To be off until the next scheduled appointment on ______________________.  [  ] Other ________________________________________________________________ ________________________________________________________________________   Sincerely yours,   Lehman Prom FNP [facility]

## 2010-11-15 NOTE — Letter (Signed)
Summary: Jenny Nelson [facility]/FAXED  [facility]/FAXED   Imported By: Arta Bruce 05/10/2010 09:27:51  _____________________________________________________________________  External Attachment:    Type:   Image     Comment:   External Document

## 2010-11-28 ENCOUNTER — Emergency Department (HOSPITAL_COMMUNITY): Payer: Medicaid Other

## 2010-11-28 ENCOUNTER — Emergency Department (HOSPITAL_COMMUNITY)
Admission: EM | Admit: 2010-11-28 | Discharge: 2010-11-28 | Disposition: A | Discharge status: Home / Self Care | Payer: Medicaid Other | Attending: Emergency Medicine | Admitting: Emergency Medicine

## 2010-11-28 DIAGNOSIS — R000 Tachycardia, unspecified: Secondary | ICD-10-CM | POA: Insufficient documentation

## 2010-11-28 DIAGNOSIS — R0602 Shortness of breath: Secondary | ICD-10-CM | POA: Insufficient documentation

## 2010-11-28 DIAGNOSIS — R509 Fever, unspecified: Secondary | ICD-10-CM | POA: Insufficient documentation

## 2010-11-28 DIAGNOSIS — R51 Headache: Secondary | ICD-10-CM | POA: Insufficient documentation

## 2010-11-28 DIAGNOSIS — IMO0001 Reserved for inherently not codable concepts without codable children: Secondary | ICD-10-CM | POA: Insufficient documentation

## 2010-11-28 DIAGNOSIS — J020 Streptococcal pharyngitis: Secondary | ICD-10-CM | POA: Insufficient documentation

## 2010-11-28 DIAGNOSIS — R079 Chest pain, unspecified: Secondary | ICD-10-CM | POA: Insufficient documentation

## 2010-11-28 LAB — RAPID STREP SCREEN (MED CTR MEBANE ONLY): Streptococcus, Group A Screen (Direct): POSITIVE — AB

## 2010-11-28 LAB — URINALYSIS, ROUTINE W REFLEX MICROSCOPIC
Hgb urine dipstick: NEGATIVE
Protein, ur: NEGATIVE mg/dL
Urine Glucose, Fasting: NEGATIVE mg/dL

## 2011-01-05 LAB — CBC
HCT: 42.6 % (ref 36.0–46.0)
Hemoglobin: 14.4 g/dL (ref 12.0–15.0)
MCHC: 33.9 g/dL (ref 30.0–36.0)
Platelets: 272 10*3/uL (ref 150–400)
RDW: 13.8 % (ref 11.5–15.5)

## 2011-01-05 LAB — POCT URINALYSIS DIP (DEVICE)
Ketones, ur: NEGATIVE mg/dL
Nitrite: NEGATIVE
Protein, ur: NEGATIVE mg/dL
Urobilinogen, UA: 2 mg/dL — ABNORMAL HIGH (ref 0.0–1.0)
pH: 7.5 (ref 5.0–8.0)

## 2011-01-05 LAB — COMPREHENSIVE METABOLIC PANEL
ALT: 17 U/L (ref 0–35)
AST: 19 U/L (ref 0–37)
Alkaline Phosphatase: 57 U/L (ref 39–117)
Calcium: 9.4 mg/dL (ref 8.4–10.5)
Creatinine, Ser: 0.74 mg/dL (ref 0.4–1.2)
Potassium: 4.3 mEq/L (ref 3.5–5.1)
Sodium: 135 mEq/L (ref 135–145)
Total Protein: 8.1 g/dL (ref 6.0–8.3)

## 2011-01-05 LAB — DIFFERENTIAL
Basophils Absolute: 0 10*3/uL (ref 0.0–0.1)
Basophils Relative: 0 % (ref 0–1)
Eosinophils Relative: 2 % (ref 0–5)
Monocytes Absolute: 0.6 10*3/uL (ref 0.1–1.0)
Monocytes Relative: 6 % (ref 3–12)
Neutro Abs: 4.7 10*3/uL (ref 1.7–7.7)
Neutrophils Relative %: 50 % (ref 43–77)

## 2011-01-05 LAB — LIPASE, BLOOD: Lipase: 21 U/L (ref 11–59)

## 2011-01-05 LAB — POCT PREGNANCY, URINE: Preg Test, Ur: NEGATIVE

## 2011-01-24 ENCOUNTER — Other Ambulatory Visit: Payer: Self-pay | Admitting: Internal Medicine

## 2011-01-24 DIAGNOSIS — Z1231 Encounter for screening mammogram for malignant neoplasm of breast: Secondary | ICD-10-CM

## 2011-02-02 ENCOUNTER — Other Ambulatory Visit: Payer: Self-pay | Admitting: Internal Medicine

## 2011-02-02 ENCOUNTER — Ambulatory Visit
Admission: RE | Admit: 2011-02-02 | Discharge: 2011-02-02 | Disposition: A | Discharge status: Home / Self Care | Payer: Medicaid Other | Source: Ambulatory Visit | Attending: Internal Medicine | Admitting: Internal Medicine

## 2011-02-02 DIAGNOSIS — Z1231 Encounter for screening mammogram for malignant neoplasm of breast: Secondary | ICD-10-CM

## 2011-02-02 DIAGNOSIS — R928 Other abnormal and inconclusive findings on diagnostic imaging of breast: Secondary | ICD-10-CM

## 2011-02-13 ENCOUNTER — Other Ambulatory Visit: Payer: Self-pay | Admitting: Internal Medicine

## 2011-02-13 DIAGNOSIS — R928 Other abnormal and inconclusive findings on diagnostic imaging of breast: Secondary | ICD-10-CM

## 2011-02-14 ENCOUNTER — Other Ambulatory Visit: Payer: Medicaid Other

## 2011-02-28 NOTE — Op Note (Signed)
Jenny Nelson, Jenny Nelson        ACCOUNT NO.:  [id number]   MEDICAL RECORD NO.:  [id number]          PATIENT TYPE:  INP   LOCATION:  9160                          [facility]ACILITY:  [facility]   PHYSICIAN:  Lesly Dukes, M.D. DATE OF BIRTH:  01/27/1966   DATE OF PROCEDURE:  08/07/2008  DATE OF DISCHARGE:                               OPERATIVE REPORT   PREOPERATIVE DIAGNOSIS:  A 45 year old para 8-0-0-7 female with  nonreassuring fetal heart tracing, thick meconium, and desiring  sterilization.   POSTOPERATIVE DIAGNOSIS:  A 45 year old para 8-0-0-7 female with  nonreassuring fetal heart tracing, thick meconium, and desiring  sterilization.   PROCEDURE:  Primary low transverse cesarean section.   SURGEON:  Lesly Dukes, MD   ASSISTANT SURGEON:  Maylon Cos, CNM   ANESTHESIA:  Epidural.   FINDINGS:  Viable female infant from deflexed LOP position, grossly normal  uterus, ovaries, and fallopian tubes.  Cord pH of 7.27, Apgars 2 at 1  minute and 7 at 5 minutes.  Placenta to pathology.   ESTIMATED BLOOD LOSS:  1000 mL.   PROCEDURE:  After informed consent obtained, the patient was taken to  the operating room where epidural anesthesia was found to be adequate.  The patient was placed in dorsal supine position with leftward tilt and  a Foley was placed in the bladder.  A Pfannenstiel skin incision was  made with a scalpel and carried down to the fascia.  The fascia was  incised in the midline.  The incision was extended bilaterally with Mayo  scissors.  The superior and inferior aspects of the fascial incision  were grasped with Kocher clamps, tented up, and dissected off sharply  and bluntly from underlying layers of rectus muscles.  Rectus muscle was  separated in the midline and the peritoneum was entered bluntly.  Bladder blade was inserted.  The uterine incision was made in transverse  fashion with uterine segment and the baby delivered atraumatically.  The  cord was clamped  and cut and baby was handed off to the awaiting  pediatrician.  Cord blood was sent for type and screen.  Cord gas was  sent with the above findings.  The placenta was delivered manually and  was sent to pathology.  The was cleared of all clots and debris and the  Pitocin was running.  There was noted to be extensions of the uterine  incision going caudally.  These were repaired with 0 Monocryl and 2-0  chromic.  The uterine artery on the left needed to be ligated due to  continued bleeding.  The rest of the incision was closed with 0 Vicryl  in a running locked fashion.  Good hemostasis was finally attained with  the uterus off tension and the abdomen.  Much time was taken to repair  the extensions and ensuring that there was good hemostasis.  The rectus  muscles and peritoneum were noted to be hemostatic.  The fascia was  closed with 0 Vicryl  in a running fashion with good hemostasis.  The subcutaneous tissue was  copiously irrigated and found to be hemostatic and skin was closed with  staples.  The patient tolerated the procedure well.  Sponge, lap,  instrument, and needle count were correct x2 and the patient was taken  to recovery in stable condition.      Lesly Dukes, M.D.  Electronically Signed     KHL/MEDQ  D:  08/07/2008  T:  08/08/2008  Job:  [id number]

## 2011-02-28 NOTE — Discharge Summary (Signed)
Jenny Nelson, Jenny Nelson        ACCOUNT NO.:  [id number]   MEDICAL RECORD NO.:  [id number]          PATIENT TYPE:  INP   LOCATION:  9128                          [facility]ILITY:  [facility]   PHYSICIAN:  Odie Sera, DO      DATE OF BIRTH:  1966/01/28   DATE OF ADMISSION:  08/07/2008  DATE OF DISCHARGE:  08/10/2008                               DISCHARGE SUMMARY   DISCHARGE DIAGNOSIS:  1. Status post low transverse C-section with delivery of viable      infant.  2. Mild pre-eclampsia   PROCEDURE:  Low transverse C-section  Nonstress test prenatally   BRIEF HISTORY OF PRESENT ILLNESS:  The patient is a 45 year old G9, P8-0-  0-7 who presented at 3 and 4 with active labor.  This began on August 07, 2008.   Patient required epidural for pain control and later magnesium, as the  patient presented with mild preeclamptic symptoms.  Additionally, the  patient had elevated blood pressure which was controlled with labetalol.  Membranes were artificially ruptured and light meconium was noted.  In  addition, nonreassuring fetal heart tones were present.  Due to this,  the patient was felt to be appropriate for low transverse C-section.  A  2005 g infant was delivered with Apgars of 2 and 7 at 1 and 5 minutes  respectively.  A pediatrician was on site and was awaiting child at  delivery.  Cord gas was 7.27.  The child was viable and active following  5-10 minutes of recusitations.  During the procedure, the patient had to  have a uterine artery on a left side ligated secondary to continued  bleeding.  There were no complications other than this.  Estimated blood  loss was 1000 mL.  Additionally, the patient underwent a bilateral tubal  ligation during this episode.  At discharge, she is breast-feeding with  good latch.  She is supplementing with bottle feeds and until her milk  comes in.  Staples will be removed at 5-7 days postoperative date by  [facility].  At this point, the patient is A  positive, RPR negative,  hep B negative, rubella immune, and HIV nonreactive.   DISCHARGE DISPOSITION:  To home in stable condition.  Well at discharge.   FOLLOWUP APPOINTMENT:  [facility] Department in 6 weeks.   ACTIVITY:  Gradually increase status post C-section, lifting no greater  than 10 lbs until follow up appointment.  Pelvic rest for 6 weeks.   DIET AT THIS POINT:  Unrestricted.   DISCHARGE MEDICATIONS:  1. Colace 100 mg 1 tablet p.o. b.i.d.  2. Percocet 5/325 mg 1-2 tablets p.o. q.4-6 h. for pain.  3. Prenatal vitamins.  4. Ibuprofen 600 mg q.6 h. p.r.n. for pain.   DISCHARGE INSTRUCTIONS:  Normal post-op care.  Can wash the wound with  mild soap in a shower.  Call physician if noticing bleeding for an  increased period, fever greater than 100.4, pain tht is worsening, or  any weeping of incision.   PERTINENT LABORATORIES INCLUDED DISCHARGE:  Hemoglobin and hematocrit of  12.4/38.7 on August 07, 2008, following the procedure.  ______________________________  Obstetrics Resident      Odie Sera, DO  Electronically Signed    OR/MEDQ  D:  08/10/2008  T:  08/10/2008  Job:  [id number]

## 2011-07-05 ENCOUNTER — Other Ambulatory Visit: Payer: Self-pay | Admitting: Internal Medicine

## 2011-07-05 DIAGNOSIS — K219 Gastro-esophageal reflux disease without esophagitis: Secondary | ICD-10-CM

## 2011-07-14 ENCOUNTER — Ambulatory Visit
Admission: RE | Admit: 2011-07-14 | Discharge: 2011-07-14 | Disposition: A | Discharge status: Home / Self Care | Payer: Medicaid Other | Source: Ambulatory Visit | Attending: Internal Medicine | Admitting: Internal Medicine

## 2011-07-14 DIAGNOSIS — K219 Gastro-esophageal reflux disease without esophagitis: Secondary | ICD-10-CM

## 2011-07-18 LAB — CROSSMATCH

## 2011-07-18 LAB — CBC
HCT: 30.1 — ABNORMAL LOW
HCT: 37
Hemoglobin: 11.9 — ABNORMAL LOW
Hemoglobin: 12.4
MCHC: 32
MCHC: 32.2
MCV: 81.8
MCV: 82.1
Platelets: 249
RBC: 4.53
RDW: 16 — ABNORMAL HIGH
RDW: 16.1 — ABNORMAL HIGH
WBC: 16.1 — ABNORMAL HIGH

## 2011-07-18 LAB — URINALYSIS, ROUTINE W REFLEX MICROSCOPIC
Hgb urine dipstick: NEGATIVE
Ketones, ur: 15 — AB
Nitrite: POSITIVE — AB
Protein, ur: 100 — AB
Urobilinogen, UA: 1

## 2011-07-18 LAB — BASIC METABOLIC PANEL
BUN: 5 — ABNORMAL LOW
Creatinine, Ser: 0.67
GFR calc non Af Amer: >60
Glucose, Bld: 147 — ABNORMAL HIGH
Potassium: 4

## 2011-07-18 LAB — COMPREHENSIVE METABOLIC PANEL
Albumin: 2.8 — ABNORMAL LOW
BUN: 5 — ABNORMAL LOW
CO2: 22
Creatinine, Ser: 0.54
Glucose, Bld: 79
Sodium: 135
Total Bilirubin: 0.6
Total Protein: 6.4

## 2011-07-18 LAB — URINE MICROSCOPIC-ADD ON

## 2011-07-18 LAB — URIC ACID: Uric Acid, Serum: 5.7

## 2011-07-18 LAB — LACTATE DEHYDROGENASE: LDH: 191

## 2011-08-03 LAB — DIFFERENTIAL
Basophils Absolute: 0.1
Lymphocytes Relative: 40
Neutro Abs: 4.8
Neutrophils Relative %: 49

## 2011-08-03 LAB — I-STAT 8, (EC8 V) (CONVERTED LAB)
Acid-base deficit: 1
Bicarbonate: 24.7 — ABNORMAL HIGH
Hemoglobin: 15.6 — ABNORMAL HIGH
Potassium: 4.1
Sodium: 138
TCO2: 26

## 2011-08-03 LAB — URINALYSIS, ROUTINE W REFLEX MICROSCOPIC
Bilirubin Urine: NEGATIVE
Nitrite: NEGATIVE
Specific Gravity, Urine: 1.028
pH: 6.5

## 2011-08-03 LAB — URINE MICROSCOPIC-ADD ON

## 2011-08-03 LAB — PREGNANCY, URINE: Preg Test, Ur: NEGATIVE

## 2011-08-03 LAB — LIPASE, BLOOD: Lipase: 22

## 2011-08-03 LAB — CBC
Platelets: 335
RDW: 13.8

## 2012-03-05 ENCOUNTER — Other Ambulatory Visit: Payer: Self-pay | Admitting: Internal Medicine

## 2012-03-05 DIAGNOSIS — Z1231 Encounter for screening mammogram for malignant neoplasm of breast: Secondary | ICD-10-CM

## 2012-03-08 ENCOUNTER — Ambulatory Visit
Admission: RE | Admit: 2012-03-08 | Discharge: 2012-03-08 | Disposition: A | Discharge status: Home / Self Care | Payer: Medicaid Other | Source: Ambulatory Visit | Attending: Internal Medicine | Admitting: Internal Medicine

## 2012-03-08 DIAGNOSIS — Z1231 Encounter for screening mammogram for malignant neoplasm of breast: Secondary | ICD-10-CM

## 2016-09-14 ENCOUNTER — Institutional Professional Consult (permissible substitution): Payer: Self-pay | Admitting: Pulmonary Disease

## 2016-10-26 ENCOUNTER — Telehealth: Payer: Self-pay | Admitting: Pulmonary Disease

## 2016-10-26 NOTE — Telephone Encounter (Signed)
IMAGING CTA CHEST 06/02/15 (personally reviewed by me):  No pulmonary embolism. No pathologic mediastinal adenopathy. No pleural effusion or thickening. Spiculated and oblong nodule within the medial right lower lobe measuring 1.2 x 0.8 cm. No other parenchymal nodule or opacity appreciated. No pericardial effusion.

## 2016-10-27 ENCOUNTER — Ambulatory Visit (INDEPENDENT_AMBULATORY_CARE_PROVIDER_SITE_OTHER): Payer: No Typology Code available for payment source | Admitting: Pulmonary Disease

## 2016-10-27 ENCOUNTER — Encounter: Payer: Self-pay | Admitting: Pulmonary Disease

## 2016-10-27 ENCOUNTER — Other Ambulatory Visit (INDEPENDENT_AMBULATORY_CARE_PROVIDER_SITE_OTHER): Payer: No Typology Code available for payment source

## 2016-10-27 VITALS — BP 140/62 | HR 69

## 2016-10-27 DIAGNOSIS — E119 Type 2 diabetes mellitus without complications: Secondary | ICD-10-CM | POA: Insufficient documentation

## 2016-10-27 DIAGNOSIS — G629 Polyneuropathy, unspecified: Secondary | ICD-10-CM | POA: Insufficient documentation

## 2016-10-27 DIAGNOSIS — M1990 Unspecified osteoarthritis, unspecified site: Secondary | ICD-10-CM

## 2016-10-27 DIAGNOSIS — R1011 Right upper quadrant pain: Secondary | ICD-10-CM

## 2016-10-27 DIAGNOSIS — J4540 Moderate persistent asthma, uncomplicated: Secondary | ICD-10-CM

## 2016-10-27 DIAGNOSIS — R091 Pleurisy: Secondary | ICD-10-CM

## 2016-10-27 DIAGNOSIS — R911 Solitary pulmonary nodule: Secondary | ICD-10-CM

## 2016-10-27 DIAGNOSIS — K219 Gastro-esophageal reflux disease without esophagitis: Secondary | ICD-10-CM

## 2016-10-27 DIAGNOSIS — R109 Unspecified abdominal pain: Secondary | ICD-10-CM | POA: Insufficient documentation

## 2016-10-27 LAB — COMPREHENSIVE METABOLIC PANEL
ALBUMIN: 3.9 g/dL (ref 3.5–5.2)
ALK PHOS: 85 U/L (ref 39–117)
ALT: 14 U/L (ref 0–35)
AST: 18 U/L (ref 0–37)
BILIRUBIN TOTAL: 0.4 mg/dL (ref 0.2–1.2)
BUN: 9 mg/dL (ref 6–23)
CO2: 29 mEq/L (ref 19–32)
CREATININE: 0.66 mg/dL (ref 0.40–1.20)
Calcium: 8.9 mg/dL (ref 8.4–10.5)
Chloride: 103 mEq/L (ref 96–112)
GFR: 120.47 mL/min (ref >60.00)
GLUCOSE: 79 mg/dL (ref 70–99)
Potassium: 3.8 mEq/L (ref 3.5–5.1)
SODIUM: 137 meq/L (ref 135–145)
TOTAL PROTEIN: 7 g/dL (ref 6.0–8.3)

## 2016-10-27 LAB — CBC WITH DIFFERENTIAL/PLATELET
BASOS ABS: 0 10*3/uL (ref 0.0–0.1)
Basophils Relative: 0.5 % (ref 0.0–3.0)
EOS PCT: 1.6 % (ref 0.0–5.0)
Eosinophils Absolute: 0.1 10*3/uL (ref 0.0–0.7)
HCT: 37.8 % (ref 36.0–46.0)
HEMOGLOBIN: 12.7 g/dL (ref 12.0–15.0)
Lymphocytes Relative: 38 % (ref 12.0–46.0)
Lymphs Abs: 2.9 10*3/uL (ref 0.7–4.0)
MCHC: 33.6 g/dL (ref 30.0–36.0)
MCV: 87.2 fl (ref 78.0–100.0)
MONO ABS: 0.6 10*3/uL (ref 0.1–1.0)
MONOS PCT: 8.4 % (ref 3.0–12.0)
NEUTROS PCT: 51.5 % (ref 43.0–77.0)
Neutro Abs: 4 10*3/uL (ref 1.4–7.7)
Platelets: 273 10*3/uL (ref 150.0–400.0)
RBC: 4.34 Mil/uL (ref 3.87–5.11)
RDW: 15 % (ref 11.5–15.5)
WBC: 7.7 10*3/uL (ref 4.0–10.5)

## 2016-10-27 LAB — SEDIMENTATION RATE: SED RATE: 32 mm/h — AB (ref 0–30)

## 2016-10-27 LAB — C-REACTIVE PROTEIN: CRP: 0.7 mg/dL (ref 0.5–20.0)

## 2016-10-27 NOTE — Patient Instructions (Addendum)
   Continue using your Advair and Proventil inhalers as prescribed.  Continue taking Prilosec as prescribed.  You need to start taking Zantac 166m at night before bed. The generic is Ranitidine which will work as well.  You need to make sure you establish care with a general practitioner to discuss your abdominal pain.  Call me if you have any questions. We will review your test results at your next appointment.   TESTS ORDERED: 1. Serum RAST Panel, CBC with differential, CMP, ESR, CRP, ANA, Smith Antibody, DS DNA Antibody, Anti-CCP, & Rheumatoid Factor. 2. Full PFTs before next appointment 3. 6MWT before next appointment on room air 4. Esophagram/Barium Swallow 5. RUQ U/S

## 2016-10-27 NOTE — Progress Notes (Signed)
Subjective:    Patient ID: Jenny Nelson, female    DOB: [birth year], 51 y.o.   MRN: [id number]  HPI History obtained through interpreter in the room during her visit. The patient started having breathing problems starting around 2012. She feels her dyspnea is progressively worsening. She reports dyspnea on exertion. She does wake up at night with dyspnea. She reports dyspnea with laying flat as well as some wheezing laying flat. She does have intermittent coughing that is nonproductive. She does have tightness and heaviness in her chest. She does have constant and chronic pain in her chest that she describes as "in her bones" down into her upper abdomen bilaterally. She reports pleurisy. She reports worsening in her breathing with exposure to extremes in temperature and humidity. She denies any breathing problems as a child. She denies any sinus congestion, pressure, or allergies. She does have reflux and dyspepsia. She does have brash water taste in her mouth. She does take her Prilosec daily but doesn't feel it is helping. She has been on Advair for years and reports she is not able to afford it at times. She is currently using Advair. She uses her rescue inhaler twice daily and does feel it helps when she uses it. She was seen for an asthma exacerbation in December 2017 last. She was treated with Prednisone.   Review of Systems No rashes or abnormal bruising. Does have intermittent headaches. She does have pain in her joints, particularly in her knees. Denies any joint erythema but does have joint swelling. She does have stiffness in her hands. A pertinent 14 point review of systems is negative except as per the history of presenting illness.  Allergies  Allergen Reactions  . Asa [Aspirin]   . Pork-Derived Products     D/t religious beliefs  . Fish Allergy Rash  . Sulfamethoxazole-Trimethoprim Rash    REACTION: Rash    No current outpatient prescriptions on file prior to visit.   No  current facility-administered medications on file prior to visit.     Past Medical History:  Diagnosis Date  . Asthma   . Cough   . Diabetes ([place])   . Diabetic neuropathy ([place])   . Hypertension   . Lung nodule     Past Surgical History:  Procedure Laterality Date  . CESAREAN SECTION      Family History  Problem Relation Age of Onset  . Diabetes Neg Hx   . Hypertension Neg Hx   . Lung disease Neg Hx     Social History   Social History  . Marital status: Married    Spouse name: N/A  . Number of children: N/A  . Years of education: N/A   Social History Main Topics  . Smoking status: Never Smoker  . Smokeless tobacco: Never Used  . Alcohol use No  . Drug use: No  . Sexual activity: Not Asked   Other Topics Concern  . None   Social History Narrative   [facility] (10/27/16):   Originally from [place]. She came to the [place] in 2008. She has only lived in [place]. She has never worked. No pets currently. No bird or mold exposure. No carpet. No indoor plants.       Objective:   Physical Exam BP 140/62 (BP Location: Right Arm, Cuff Size: Normal)   Pulse 69   SpO2 99%  General:  Awake. Alert. No acute distress. Obese female.  Integument:  Warm & dry. No rash on exposed skin. No  bruising. Extremities:  No cyanosis or clubbing.  Lymphatics:  No appreciated cervical or supraclavicular lymphadenoapthy. HEENT:  Moist mucus membranes. No oral ulcers. No scleral injection or icterus. No significant nasal turbinate swelling. Cardiovascular:  Regular rate and rhythm. No edema. No appreciable JVD.  Pulmonary:  Good aeration & clear to auscultation bilaterally. Symmetric chest wall expansion. No accessory muscle use on room air. Abdomen: Soft. Normal bowel sounds. Protuberant. Tender to palpation especially in right upper quadrant. No guarding. Musculoskeletal:  Normal bulk and tone. Hand grip strength 5/5 bilaterally. No joint deformity or effusion appreciated. Neurological:  CN  2-12 grossly in tact. No meningismus. Moving all 4 extremities equally. Symmetric brachioradialis deep tendon reflexes. Psychiatric:  Mood and affect congruent. Speech normal rhythm, rate & tone.   IMAGING CT CHEST W/O 12/15/15 (per radiologist): 9 mm right lower lobe pulmonary nodule. Heart borderline in size. Small scattered mediastinal lymph nodes without pathologic enlargement. No hilar or axillary adenopathy. Reportedly nodule has not changed since previous study on 10/21/15. No pleural effusion or consolidation. Chest wall soft tissues are unremarkable without bony abnormality or focal bone lesion.  CTA CHEST 06/02/15 (personally reviewed by me):  No pulmonary embolism. No pathologic mediastinal adenopathy. No pleural effusion or thickening. Spiculated and oblong nodule within the medial right lower lobe measuring 1.2 x 0.8 cm. No other parenchymal nodule or opacity appreciated. No pericardial effusion.    Assessment & Plan:  51 y.o. female originally from [place]. Patient was previously diagnosed with asthma which could be causing some of her symptoms. However, she also seems to be having pleurisy and unexplained abdominal pain. Her last abdominal ultrasound was from January 2017 without obvious abnormality. I am concerned that with the pleuritic component to her chest pain she could have some inflammatory process and possibly something autoimmune causing not only her arthritis but also having pleural involvement. Reviewing her previous CT imaging does show a right lower lobe nodule which could be the nodule mentioned in her March 2017 CT scan. Further chest imaging may be necessary to continue to monitor for any signs of progression. Certainly her uncontrolled reflux could be having an effect on her underlying asthma with silent laryngo-esophageal reflux. I did spend a significant amount of time today discussing the patient's inhaler regimen as well as appropriate dosing and frequency along with oral  hygiene through use of an interpreter. I did instruct the patient to establish care with a primary care physician to address her multiple and chronic medical problems.  1. Moderate, Persistent Asthma: Continuing Advair and Proventil inhaler as needed. Checking full pulmonary function testing and 6 minute walk test on room air before next appointment. Checking RAST panel & CBC with differential. 2. Pleurisy: Checking ESR, CRP, ANA, Smith antibody, and double-stranded DNA antibody. 3. Right Lung Nodule: May be stable going back to 2016. Consider repeat CT imaging depending upon further workup results. 4. GERD: Continuing Prilosec. Starting Zantac 100 mg by mouth daily at bedtime. Checking esophagogram/barium swallow. 5. Arthritis: Checking serum ESR, CRP, ANA, anti-CCP, and rheumatoid factor. 6. Right Upper Quadrant Abdominal Pain: Checking CMP and right upper quadrant ultrasound. Previously seen by GI. 7. Follow-up: Patient will return to clinic in 4 weeks or sooner if needed.  Sonia Baller Ashok Cordia, M.D. [facility] Pager:  [phone] After 3pm or if no response, call [phone] 12:57 PM 10/27/16

## 2016-10-30 ENCOUNTER — Ambulatory Visit: Payer: Self-pay

## 2016-10-30 ENCOUNTER — Ambulatory Visit (INDEPENDENT_AMBULATORY_CARE_PROVIDER_SITE_OTHER): Payer: No Typology Code available for payment source | Admitting: Pulmonary Disease

## 2016-10-30 DIAGNOSIS — R0602 Shortness of breath: Secondary | ICD-10-CM

## 2016-10-30 DIAGNOSIS — J4540 Moderate persistent asthma, uncomplicated: Secondary | ICD-10-CM

## 2016-10-30 DIAGNOSIS — R911 Solitary pulmonary nodule: Secondary | ICD-10-CM

## 2016-10-30 LAB — RESPIRATORY ALLERGY PROFILE REGION II ~~LOC~~
ALLERGEN, D PTERNOYSSINUS, D1: 0.15 kU/L — AB
Allergen, A. alternata, m6: <0.1 kU/L
Allergen, Cedar tree, t12: <0.1 kU/L
Allergen, Comm Silver Birch, t9: <0.1 kU/L
Allergen, Cottonwood, t14: <0.1 kU/L
Allergen, Mouse Urine Protein, e78: <0.1 kU/L
Allergen, Oak,t7: <0.1 kU/L
Allergen, P. notatum, m1: <0.1 kU/L
Aspergillus fumigatus, m3: <0.1 kU/L
Box Elder IgE: <0.1 kU/L
Common Ragweed: <0.1 kU/L
D. FARINAE: 0.21 kU/L — AB
IgE (Immunoglobulin E), Serum: 57 kU/L (ref <115)
Rough Pigweed  IgE: <0.1 kU/L
Sheep Sorrel IgE: <0.1 kU/L
Timothy Grass: <0.1 kU/L

## 2016-10-30 LAB — PULMONARY FUNCTION TEST
DL/VA % PRED: 121 %
DL/VA: 5.82 ml/min/mmHg/L
DLCO UNC: 21.13 ml/min/mmHg
DLCO cor % pred: 86 %
DLCO cor: 20.94 ml/min/mmHg
DLCO unc % pred: 87 %
FEF 25-75 PRE: 1.9 L/s
FEF 25-75 Post: 2.33 L/sec
FEF2575-%CHANGE-POST: 22 %
FEF2575-%PRED-POST: 99 %
FEF2575-%PRED-PRE: 81 %
FEV1-%Change-Post: 5 %
FEV1-%PRED-POST: 82 %
FEV1-%PRED-PRE: 78 %
FEV1-PRE: 1.77 L
FEV1-Post: 1.86 L
FEV1FVC-%Change-Post: 6 %
FEV1FVC-%PRED-PRE: 102 %
FEV6-%Change-Post: -1 %
FEV6-%PRED-POST: 76 %
FEV6-%Pred-Pre: 77 %
FEV6-POST: 2.11 L
FEV6-Pre: 2.14 L
FEV6FVC-%PRED-POST: 103 %
FEV6FVC-%PRED-PRE: 103 %
FVC-%Change-Post: -1 %
FVC-%Pred-Post: 74 %
FVC-%Pred-Pre: 75 %
FVC-Post: 2.11 L
FVC-Pre: 2.14 L
POST FEV1/FVC RATIO: 88 %
Post FEV6/FVC ratio: 100 %
Pre FEV1/FVC ratio: 83 %
Pre FEV6/FVC Ratio: 100 %
RV % PRED: 72 %
RV: 1.34 L
TLC % pred: 69 %
TLC: 3.51 L

## 2016-10-30 LAB — ANTI-SMITH ANTIBODY: ENA SM Ab Ser-aCnc: <1

## 2016-10-30 LAB — RHEUMATOID FACTOR

## 2016-10-30 LAB — CYCLIC CITRUL PEPTIDE ANTIBODY, IGG

## 2016-10-30 LAB — ANTI-DNA ANTIBODY, DOUBLE-STRANDED

## 2016-10-30 LAB — ANA: Anti Nuclear Antibody(ANA): NEGATIVE

## 2016-10-30 NOTE — Progress Notes (Signed)
PFT done today. 10/30/16

## 2016-10-31 NOTE — Progress Notes (Signed)
6MWT 10/30/16:  Walked 345 meters / Baseline Sat 100% on RA / Nadir Sat 100% on RA (stopped @ 2 min due to fatigue)

## 2016-11-08 ENCOUNTER — Encounter (HOSPITAL_COMMUNITY): Payer: Self-pay | Admitting: Radiology

## 2016-11-08 ENCOUNTER — Ambulatory Visit (HOSPITAL_COMMUNITY)
Admission: RE | Admit: 2016-11-08 | Discharge: 2016-11-08 | Disposition: A | Discharge status: Home / Self Care | Payer: Medicaid Other | Source: Ambulatory Visit | Attending: Pulmonary Disease | Admitting: Pulmonary Disease

## 2016-11-08 DIAGNOSIS — R1011 Right upper quadrant pain: Secondary | ICD-10-CM | POA: Insufficient documentation

## 2016-11-08 DIAGNOSIS — K219 Gastro-esophageal reflux disease without esophagitis: Secondary | ICD-10-CM | POA: Diagnosis present

## 2016-11-08 DIAGNOSIS — K449 Diaphragmatic hernia without obstruction or gangrene: Secondary | ICD-10-CM | POA: Diagnosis not present

## 2016-11-21 ENCOUNTER — Telehealth: Payer: Self-pay | Admitting: Pulmonary Disease

## 2016-11-21 NOTE — Telephone Encounter (Signed)
LABS 08/02/16 CBC:  5.4/12.8/38.5/304 BMP:  139/4.4/102/27/12/0.78/89/9.3 LFT:  4.1/7.0/0.6/89/18/16

## 2016-11-24 ENCOUNTER — Telehealth: Payer: Self-pay | Admitting: Pulmonary Disease

## 2016-11-24 ENCOUNTER — Ambulatory Visit (INDEPENDENT_AMBULATORY_CARE_PROVIDER_SITE_OTHER): Payer: No Typology Code available for payment source | Admitting: Pulmonary Disease

## 2016-11-24 ENCOUNTER — Encounter: Payer: Self-pay | Admitting: Pulmonary Disease

## 2016-11-24 VITALS — BP 130/80 | HR 74 | Temp 98.6°F | Ht 63.0 in | Wt 219.6 lb

## 2016-11-24 DIAGNOSIS — J4540 Moderate persistent asthma, uncomplicated: Secondary | ICD-10-CM

## 2016-11-24 DIAGNOSIS — J111 Influenza due to unidentified influenza virus with other respiratory manifestations: Secondary | ICD-10-CM

## 2016-11-24 DIAGNOSIS — K219 Gastro-esophageal reflux disease without esophagitis: Secondary | ICD-10-CM

## 2016-11-24 NOTE — Telephone Encounter (Signed)
PFT 10/30/16: FVC 2.14 L (75%) FEV1 1.7 cm (78%) FEV1/FVC 0.83 FEF 25-75 1.90 L (81%) negative bronchodilator response TLC 3.51 L (69%) RV 72% ERV 104% DLCO corrected 86%  6MWT 10/30/16:  Walked 345 meters / Baseline Sat 100% on RA / Nadir Sat 100% on RA (stopped @ 2 min due to fatigue)  IMAGING ESOPHAGRAM/BARIUM SWALLOW 11/08/16 (per radiologist): Small hiatal hernia. No mass or reflux appreciated.  RUQ US ABDOMEN 11/08/16 (per radiologist): No focal lesion identified. Parenchymal echogenicity within normal limits.  LABS 10/27/16 CBC: 7.7/12.7/37.8/273 BMP: 137/3.8/103/29/9/0.66/79/8.9 LFT: 3.9/7.0/0.4/85/18/14 CRP: 0.7 ESR: 32 IgE: 57 Rast Panel:  D pternoyssinus 0.15 & D farinae 0.21 ANA: Negative RF:  <14 Anti-CCP:  <16  08/02/16 CBC:  5.4/12.8/38.5/304 BMP:  139/4.4/102/27/12/0.78/89/9.3 LFT:  4.1/7.0/0.6/89/18/16

## 2016-11-24 NOTE — Patient Instructions (Signed)
   Keep taking your medications as prescribed.  Call our office if you breathing gets any worse or you develop any new symptoms.  We will see you back in 2 weeks to make sure you are getting over the influenza.  Make sure you call your Gastroenterologist to be seen for your abdominal pain.

## 2016-11-24 NOTE — Progress Notes (Signed)
Subjective:    Patient ID: Jenny Nelson, female    DOB: 1966-02-22, 51 y.o.   MRN: [id number]  C.C.:  Follow-up for Moderate, Persistent Asthma, Pleurisy, Right Lower Lobe Nodule, GERD, & Right Upper Quadrant Abdominal Pain.   HPI  History obtained through interpreter in the room during the visit. She was diagnosed with influenza on Wednesday. She was prescribed Tamiflu but she cannot afford it. No other sick contacts.  Moderate, Persistent Asthma: Currently prescribed Advair and Proventil inhaler as needed. Her breathing has been doing good. She has a cough that persists and has been producing a thick mucus. She has also had audible wheezing. She has been compliant with her Advair. She has been using her rescue inhaler 3 times daily. She has been waking up at night with coughing & wheezing.   Pleurisy: Autoimmune workup was negative. She has had pain in her chest radiating to her back. She is still having Pleurisy. Patient is endorsing myalgias in her back and chest wall.  Right Lower Lobe Nodule: Measures up to 1.2 cm. Initially seen on August 2016 CT imaging with no sign of enlargement by radiology report in March 2017.   GERD: Zantac added to patient's regimen of Prilosec at last appointment. She did have a small hiatal hernia on her Esophagram. She reports she is compliant with the medication. She is still having some reflux but less. She reports reflux primarily with her episodes of abdominal pain.  Right Upper Quadrant Abdominal Pain: Previously evaluated by gastroenterology. Right upper quadrant ultrasound, esophagogram, and serum workup all negative.She reports her pain continues.   Review of Systems She has no fever or chills. She is having intermittent sweats with her coughing spells. She does have nausea but no emesis. Denies diarrhea. No rashes or bruising.   Allergies  Allergen Reactions  . Asa [Aspirin]   . Pork-Derived Products     D/t religious beliefs  . Fish  Allergy Rash  . Sulfamethoxazole-Trimethoprim Rash    REACTION: Rash    Current Outpatient Prescriptions on File Prior to Visit  Medication Sig Dispense Refill  . albuterol (PROVENTIL HFA;VENTOLIN HFA) 108 (90 Base) MCG/ACT inhaler Inhale into the lungs every 6 (six) hours as needed for wheezing or shortness of breath.    Marland Kitchen atorvastatin (LIPITOR) 20 MG tablet Take 10 mg by mouth daily.    . Fluticasone-Salmeterol (ADVAIR) 250-50 MCG/DOSE AEPB Inhale 1 puff into the lungs 2 (two) times daily.    Marland Kitchen lisinopril (PRINIVIL,ZESTRIL) 10 MG tablet Take 10 mg by mouth daily.    . meloxicam (MOBIC) 15 MG tablet Take 7.5 mg by mouth daily.    . metFORMIN (GLUCOPHAGE) 500 MG tablet Take by mouth 2 (two) times daily with a meal.    . metoprolol tartrate (LOPRESSOR) 25 MG tablet Take 25 mg by mouth 2 (two) times daily.    Marland Kitchen omeprazole (PRILOSEC) 40 MG capsule Take 40 mg by mouth daily.    . traMADol (ULTRAM) 50 MG tablet Take by mouth every 6 (six) hours as needed.     No current facility-administered medications on file prior to visit.     Past Medical History:  Diagnosis Date  . Asthma   . Cough   . Diabetes ([place])   . Diabetic neuropathy ([place])   . Hypertension   . Lung nodule     Past Surgical History:  Procedure Laterality Date  . CESAREAN SECTION      Family History  Problem Relation Age of Onset  .  Diabetes Neg Hx   . Hypertension Neg Hx   . Lung disease Neg Hx     Social History   Social History  . Marital status: Married    Spouse name: N/A  . Number of children: N/A  . Years of education: N/A   Social History Main Topics  . Smoking status: Never Smoker  . Smokeless tobacco: Never Used  . Alcohol use No  . Drug use: No  . Sexual activity: Not Asked   Other Topics Concern  . None   Social History Narrative   [facility] (10/27/16):   Originally from [place]. She came to the [place] in 2008. She has only lived in [place]. She has never worked. No pets currently. No bird  or mold exposure. No carpet. No indoor plants.       Objective:   Physical Exam BP 130/80 (BP Location: Right Arm, Patient Position: Sitting, Cuff Size: Normal)   Pulse 74   Temp 98.6 F (37 C)   Ht 5' 3"  (1.6 m)   Wt 219 lb 9.6 oz (99.6 kg)   SpO2 98%   BMI 38.90 kg/m   General:  Awake. Alert. No acute distress. Obese. Interpreter with patient today.  Integument:  Warm & dry. No rash on exposed skin.  Extremities:  No cyanosis or clubbing.  HEENT:  Moist mucus membranes. No oral ulcers. No nasal turbinate swelling. No scleral icterus. Cardiovascular:  Regular rate. No edema. Normal S1 & S2. Pulmonary:  Clear on auscultation bilaterally. No accessory muscle use. Good aeration bilaterally. Abdomen: Soft. Normal bowel sounds. Protuberant. Musculoskeletal:  Normal bulk and tone. No joint deformity or effusion appreciated.  PFT 10/30/16: FVC 2.14 L (75%) FEV1 1.77 L (78%) FEV1/FVC 0.83 FEF 25-75 1.90 L (81%) negative bronchodilator response TLC 3.51 L (69%) RV 72% ERV 104% DLCO corrected 86%  6MWT 10/30/16: Walked 345 meters / Baseline Sat 100% on RA / Nadir Sat 100% on RA (stopped @ 2 min due to fatigue)  IMAGING ESOPHAGRAM/BARIUM SWALLOW 11/08/16 (per radiologist): Small hiatal hernia. No mass or reflux appreciated.  RUQ US ABDOMEN 11/08/16 (per radiologist): No focal lesion identified. Parenchymal echogenicity within normal limits.  CT CHEST W/O 12/15/15 (per radiologist): 9 mm right lower lobe pulmonary nodule. Heart borderline in size. Small scattered mediastinal lymph nodes without pathologic enlargement. No hilar or axillary adenopathy. Reportedly nodule has not changed since previous study on 10/21/15. No pleural effusion or consolidation. Chest wall soft tissues are unremarkable without bony abnormality or focal bone lesion.  CTA CHEST 06/02/15 (previously reviewed by me):  No pulmonary embolism. No pathologic mediastinal adenopathy. No pleural effusion or thickening. Spiculated  and oblong nodule within the medial right lower lobe measuring 1.2 x 0.8 cm. No other parenchymal nodule or opacity appreciated. No pericardial effusion.  LABS 10/27/16 CBC: 7.7/12.7/37.8/273 Absolute Eosinophils:  0.1 BMP: 137/3.8/103/29/9/0.66/79/8.9 LFT: 3.9/7.0/0.4/85/18/14 CRP: 0.7 ESR: 32 IgE: 57 Rast Panel:  D pternoyssinus 0.15 & D farinae 0.21 ANA: Negative RF:  <14 Anti-CCP:  <16  08/02/16 CBC: 5.4/12.8/38.5/304 BMP: 139/4.4/102/27/12/0.78/89/9.3 LFT: 4.1/7.0/0.6/89/18/16    Assessment & Plan:  51 y.o. female from [place] with underlying moderate, persistent asthma, pleurisy, right lower lobe nodule, GERD, and right upper quadrant abdominal pain. Reviewed patient's pulmonary function testing which does show mild restriction but with normal carbon dioxide diffusion capacity likely secondary to her central obesity. She has no significant bronchodilator response or evidence of fixed airway obstruction. Her 6 minute walk test shows no desaturation despite dyspnea and fatigue  which is likely due to some part to deconditioning. We did discuss her influenza bronchitis and the need for further treatment if she develops any new symptoms. I did refer the patient back to her gastroenterologist given ongoing abdominal pain which could be due to her hiatal hernia although this was small. I instructed the patient contact my office if she had any new breathing problems or questions before her next appointment.  1. Influenza Bronchitis: Patient unable to afford Tamiflu. Holding off on further imaging or testing at this time. Patient instructed to seek medical care and notify me for any new symptoms. 2. Moderate, Persistent Asthma: Continuing Advair discus & albuterol inhaler as needed. No indication of exacerbation at this time. 3. Pleurisy: Multifactorial with myalgias from influenza. 4. Right Lower Lobe Nodule: Holding off on further imaging at this time. Consider repeat CT scan of the chest  without contrast in August 2018. 5. GERD: Hiatal hernia on esophagram. Continuing Prilosec & Zantac. 6. Right Upper Quadrant Abdominal Pain: Recommended follow-up with her gastroenterologist. 7. Follow-up: Patient will return to clinic in 2 weeks with first available provider in 3 months with me.  Sonia Baller. Ashok Cordia, M.D. [facility] Pager:  [phone] After 3pm or if no response, call [phone] 11:07 AM 11/24/16

## 2016-12-08 ENCOUNTER — Ambulatory Visit: Payer: Self-pay | Admitting: Pulmonary Disease

## 2017-12-19 ENCOUNTER — Ambulatory Visit (INDEPENDENT_AMBULATORY_CARE_PROVIDER_SITE_OTHER): Payer: Self-pay | Admitting: Family Medicine

## 2017-12-19 ENCOUNTER — Encounter: Payer: Self-pay | Admitting: Family Medicine

## 2017-12-19 VITALS — BP 129/84 | HR 104 | Wt 214.0 lb

## 2017-12-19 DIAGNOSIS — E1159 Type 2 diabetes mellitus with other circulatory complications: Secondary | ICD-10-CM | POA: Insufficient documentation

## 2017-12-19 DIAGNOSIS — I152 Hypertension secondary to endocrine disorders: Secondary | ICD-10-CM

## 2017-12-19 DIAGNOSIS — K219 Gastro-esophageal reflux disease without esophagitis: Secondary | ICD-10-CM | POA: Insufficient documentation

## 2017-12-19 DIAGNOSIS — I10 Essential (primary) hypertension: Secondary | ICD-10-CM

## 2017-12-19 DIAGNOSIS — S39012A Strain of muscle, fascia and tendon of lower back, initial encounter: Secondary | ICD-10-CM

## 2017-12-19 HISTORY — DX: Type 2 diabetes mellitus with other circulatory complications: E11.59

## 2017-12-19 HISTORY — DX: Gastro-esophageal reflux disease without esophagitis: K21.9

## 2017-12-19 HISTORY — DX: Hypertension secondary to endocrine disorders: I15.2

## 2017-12-19 MED ORDER — CYCLOBENZAPRINE HCL 10 MG PO TABS: 10.0000 mg | ORAL_TABLET | Freq: Three times a day (TID) | ORAL | 0 refills | Status: AC | PRN

## 2017-12-19 NOTE — Patient Instructions (Signed)
Thank you for coming in today. Continue meloxicam  Apply for [facility]  [facility]  Use the cyclobenzaprine for muscle spasm.  Recheck with me in 1 month.    Lumbosacral Strain Lumbosacral strain is an injury that causes pain in the lower back (lumbosacral spine). This injury usually occurs from overstretching the muscles or ligaments along your spine. A strain can affect one or more muscles or cord-like tissues that connect bones to other bones (ligaments). What are the causes? This condition may be caused by:  A hard, direct hit (blow) to the back.  Excessive stretching of the lower back muscles. This may result from: ? A fall. ? Lifting something heavy. ? Repetitive movements such as bending or crouching.  What increases the risk? The following factors may increase your risk of getting this condition:  Participating in sports or activities that involve: ? A sudden twist of the back. ? Pushing or pulling motions.  Being overweight or obese.  Having poor strength and flexibility, especially tight hamstrings or weak muscles in the back or abdomen.  Having too much of a curve in the lower back.  Having a pelvis that is tilted forward.  What are the signs or symptoms? The main symptom of this condition is pain in the lower back, at the site of the strain. Pain may extend (radiate) down one or both legs. How is this diagnosed? This condition is diagnosed based on:  Your symptoms.  Your medical history.  A physical exam. ? Your health care provider may push on certain areas of your back to determine the source of your pain. ? You may be asked to bend forward, backward, and side to side to assess the severity of your pain and your range of motion.  Imaging tests, such as: ? X-rays. ? MRI.  How is this treated? Treatment for this condition may include:  Putting heat and cold on the affected area.  Medicines to help relieve pain  and relax your muscles (muscle relaxants).  NSAIDs to help reduce swelling and discomfort.  When your symptoms improve, it is important to gradually return to your normal routine as soon as possible to reduce pain, avoid stiffness, and avoid loss of muscle strength. Generally, symptoms should improve within 6 weeks of treatment. However, recovery time varies. Follow these instructions at home: Managing pain, stiffness, and swelling   If directed, put ice on the injured area during the first 24 hours after your strain. ? Put ice in a plastic bag. ? Place a towel between your skin and the bag. ? Leave the ice on for 20 minutes, 2-3 times a day.  If directed, put heat on the affected area as often as told by your health care provider. Use the heat source that your health care provider recommends, such as a moist heat pack or a heating pad. ? Place a towel between your skin and the heat source. ? Leave the heat on for 20-30 minutes. ? Remove the heat if your skin turns bright red. This is especially important if you are unable to feel pain, heat, or cold. You may have a greater risk of getting burned. Activity  Rest and return to your normal activities as told by your health care provider. Ask your health care provider what activities are safe for you.  Avoid activities that take a lot of energy for as long as told by your health care provider. General instructions  Take over-the-counter  and prescription medicines only as told by your health care provider.  Donot drive or use heavy machinery while taking prescription pain medicine.  Do not use any products that contain nicotine or tobacco, such as cigarettes and e-cigarettes. If you need help quitting, ask your health care provider.  Keep all follow-up visits as told by your health care provider. This is important. How is this prevented?  Use correct form when playing sports and lifting heavy objects.  Use good posture when sitting  and standing.  Maintain a healthy weight.  Sleep on a mattress with medium firmness to support your back.  Be safe and responsible while being active to avoid falls.  Do at least 150 minutes of moderate-intensity exercise each week, such as brisk walking or water aerobics. Try a form of exercise that takes stress off your back, such as swimming or stationary cycling.  Maintain physical fitness, including: ? Strength. ? Flexibility. ? Cardiovascular fitness. ? Endurance. Contact a health care provider if:  Your back pain does not improve after 6 weeks of treatment.  Your symptoms get worse. Get help right away if:  Your back pain is severe.  You cannot stand or walk.  You have difficulty controlling when you urinate or when you have a bowel movement.  You feel nauseous or you vomit.  Your feet get very cold.  You have numbness, tingling, weakness, or problems using your arms or legs.  You develop any of the following: ? Shortness of breath. ? Dizziness. ? Pain in your legs. ? Weakness in your buttocks or legs. ? Discoloration of the skin on your toes or legs. This information is not intended to replace advice given to you by your health care provider. Make sure you discuss any questions you have with your health care provider. Document Released: 07/12/2005 Document Revised: 04/21/2016 Document Reviewed: 03/05/2016 Elsevier Interactive Patient Education  [employer]2018 [employer]

## 2017-12-19 NOTE — Progress Notes (Signed)
Jenny EpsteinCharlotte R Nelson is a 52 y.o. female who presents to [facility] today for back pain and knee pain.   Back Pain: Jenny GowerCharlotte notes a long history of chronic mild low back pain.  However the pain worsened over the  last month and is located in the right low back without radiation down the leg.  She denies any new weakness or numbness or bowel bladder dysfunction.  She denies any injury to her back.  She thinks in the past she has been diagnosed with arthritis in her back.  She is tried taking meloxicam which is only helped a bit.  Gabapentin has only helped a little bit as well.  Knee pain: Jenny GowerCharlotte notes bilateral right worse than left knee pain.  The pain is been ongoing now for years.  She notes the pain is located at the anterior knee and worse with activity.  She reports having x-rays at [facility] revealing significant DJD.  Again is noted above she has been trying meloxicam which has not been very helpful.  She denies significant locking or catching.  She does not have health insurance has limited access to medications and diagnostic tests.   Past Medical History:  Diagnosis Date  . GERD (gastroesophageal reflux disease) 12/19/2017  . Hypertension associated with diabetes (HCC) 12/19/2017   History reviewed. No pertinent surgical history. Social History   Tobacco Use  . Smoking status: Never Smoker  . Smokeless tobacco: Never Used  Substance Use Topics  . Alcohol use: Not on file   family history is not on file.  ROS:  No headache, visual changes, nausea, vomiting, diarrhea, constipation, dizziness, abdominal pain, skin rash, fevers, chills, night sweats, weight loss, swollen lymph nodes, body aches, joint swelling, muscle aches, chest pain, shortness of breath, mood changes, visual or auditory hallucinations.    Medications: Current Outpatient Medications  Medication Sig Dispense Refill  . atorvastatin (LIPITOR) 20 MG  tablet Take 20 mg by mouth daily.    Marland Kitchen. gabapentin (NEURONTIN) 300 MG capsule Take 300 mg by mouth 3 (three) times daily.    Marland Kitchen. lisinopril (PRINIVIL,ZESTRIL) 20 MG tablet Take 20 mg by mouth daily.    . meloxicam (MOBIC) 15 MG tablet Take 15 mg by mouth daily.    . metFORMIN (GLUCOPHAGE) 500 MG tablet Take by mouth 2 (two) times daily with a meal.    . metoprolol tartrate (LOPRESSOR) 25 MG tablet Take 25 mg by mouth 2 (two) times daily.    Marland Kitchen. omeprazole (PRILOSEC) 40 MG capsule TK ONE C PO ONCE D B A MEAL  2  . traMADol (ULTRAM) 50 MG tablet TK 1 T PO Q 6 TO 8 H PRF 30 DAYS  0  . cyclobenzaprine (FLEXERIL) 10 MG tablet Take 1 tablet (10 mg total) by mouth 3 (three) times daily as needed for muscle spasms. 30 tablet 0   No current facility-administered medications for this visit.    Allergies  Allergen Reactions  . Aspirin   . Bactrim [Sulfamethoxazole-Trimethoprim]      Exam:  BP 129/84   Pulse (!) 104   Wt 214 lb (97.1 kg)  General: Well Developed, well nourished, and in no acute distress.  Neuro/Psych: Alert and oriented x3, extra-ocular muscles intact, able to move all 4 extremities, sensation grossly intact. Skin: Warm and dry, no rashes noted.  Respiratory: Not using accessory muscles, speaking in full sentences, trachea midline.  Cardiovascular: Pulses palpable, no extremity edema. Abdomen: Does not  appear distended. MSK:  L-spine: Nontender to spinal midline.  Lateral lumbar paraspinal muscle group Lumbar motion is limited especially with  extension and right lateral flexion and rotation Negative slump test bilaterally Reflexes equal normal bilateral lower extremities Strength is intact bilateral lower extremities  Right knee: Normal-appearing no effusion Range of Motion 0-120 degrees 2+ retropatellar crepitations Diffusely tender Intact extension and flexion strength Stable ligamentous exam  Left knee: Normal-appearing no effusion Range of Motion 0-120 degrees 2+  retropatellar crepitations Diffusely tender Intact extension and flexion strength Stable ligamentous exam  Report of CT scan of abdomen and pelvis from 2015 reveals diffuse disc disease.  BL Knee xray 2011 shows mild DJD.   I personally (independently) visualized and performed the interpretation of the knee xray images attached in this note.   No results found for this or any previous visit (from the past 48 hour(s)). No results found.    Assessment and Plan: 52 y.o. female with  Lspine Pain: Patient has a history of lumbar degenerative disc disease based on self-report and review of CT abdomen and pelvis report from 2015.  However she did not have significant pain until 1 month ago.  Fortunately she denies any history of trauma.  I believe her pain is predominantly related to lumbosacral strain of the muscles or fascia.  She would significantly benefit from physical therapy.  However her lack of health insurance is a significant barrier.  I advised her and provided her with the packet to apply for the [facility] health charity program.  Additionally I recommended her and provided referral to [facility] physical therapy pro bono clinic.  Additionally I will prescribe cyclobenzaprine muscle relaxers to augment meloxicam and gabapentin.  Recheck in 1 month  Bilateral right worse than left knee pain: Very likely degenerative joint disease based on history and review of x-ray from 2011.  She notes x-ray most recently which I do not have access today showed more severe DJD.  Plan for trial of Aspercreme and quadricep strengthening.  If not better in next check proceed with steroid injection.  I did not proceed with steroid injection today she has not had a full trial of conservative management and I would like to avoid steroids in the setting of diabetes.    Orders Placed This Encounter  Procedures  . Ambulatory referral to Physical Therapy    Referral Priority:   Routine    Referral  Type:   Physical Medicine    Referral Reason:   Specialty Services Required    Requested Specialty:   Physical Therapy    Number of Visits Requested:   1   Meds ordered this encounter  Medications  . cyclobenzaprine (FLEXERIL) 10 MG tablet    Sig: Take 1 tablet (10 mg total) by mouth 3 (three) times daily as needed for muscle spasms.    Dispense:  30 tablet    Refill:  0    Discussed warning signs or symptoms. Please see discharge instructions. Patient expresses understanding.

## 2017-12-20 ENCOUNTER — Encounter: Payer: Self-pay | Admitting: Pulmonary Disease

## 2018-01-16 ENCOUNTER — Ambulatory Visit (INDEPENDENT_AMBULATORY_CARE_PROVIDER_SITE_OTHER): Payer: Medicaid Other | Admitting: Family Medicine

## 2018-01-16 ENCOUNTER — Ambulatory Visit (INDEPENDENT_AMBULATORY_CARE_PROVIDER_SITE_OTHER): Payer: Medicaid Other

## 2018-01-16 ENCOUNTER — Encounter: Payer: Self-pay | Admitting: Family Medicine

## 2018-01-16 VITALS — BP 114/76 | HR 68 | Ht 62.99 in | Wt 214.0 lb

## 2018-01-16 DIAGNOSIS — M5442 Lumbago with sciatica, left side: Secondary | ICD-10-CM

## 2018-01-16 DIAGNOSIS — M25562 Pain in left knee: Secondary | ICD-10-CM

## 2018-01-16 DIAGNOSIS — M170 Bilateral primary osteoarthritis of knee: Secondary | ICD-10-CM | POA: Diagnosis not present

## 2018-01-16 DIAGNOSIS — G8929 Other chronic pain: Secondary | ICD-10-CM

## 2018-01-16 DIAGNOSIS — M25561 Pain in right knee: Secondary | ICD-10-CM

## 2018-01-16 NOTE — Progress Notes (Signed)
Jenny Nelson is a 52 y.o. female who presents to [facility] today for follow-up back and leg pain.  Jenny Nelson was seen about a month ago for low back pain and bilateral knee pain.  She had a trial of meloxicam and cyclobenzaprine which has not helped at all.  She notes moderate to severe right low back pain and bilateral moderate to severe knee pain.  She was referred to [facility] pro bono physical therapy clinic and has her first appointment next week.   She notes some pain radiating down her left posterior leg.  She denies any significant weakness bowel bladder dysfunction or new numbness.     Past Medical History:  Diagnosis Date  . Asthma   . Cough   . Diabetes (HCC)   . Diabetic neuropathy (HCC)   . GERD (gastroesophageal reflux disease) 12/19/2017  . Hypertension   . Hypertension associated with diabetes (HCC) 12/19/2017  . Lung nodule    Past Surgical History:  Procedure Laterality Date  . CESAREAN SECTION     Social History   Tobacco Use  . Smoking status: Never Smoker  . Smokeless tobacco: Never Used  Substance Use Topics  . Alcohol use: No     ROS:  As above   Medications: Current Outpatient Medications  Medication Sig Dispense Refill  . albuterol (PROVENTIL HFA;VENTOLIN HFA) 108 (90 Base) MCG/ACT inhaler Inhale into the lungs every 6 (six) hours as needed for wheezing or shortness of breath.    Marland Kitchen atorvastatin (LIPITOR) 20 MG tablet Take 10 mg by mouth daily.    Marland Kitchen atorvastatin (LIPITOR) 20 MG tablet Take 20 mg by mouth daily.    . cyclobenzaprine (FLEXERIL) 10 MG tablet Take 1 tablet (10 mg total) by mouth 3 (three) times daily as needed for muscle spasms. 30 tablet 0  . Fluticasone-Salmeterol (ADVAIR) 250-50 MCG/DOSE AEPB Inhale 1 puff into the lungs 2 (two) times daily.    Marland Kitchen gabapentin (NEURONTIN) 300 MG capsule Take 300 mg by mouth 3 (three) times daily.    Marland Kitchen lisinopril (PRINIVIL,ZESTRIL) 10 MG  tablet Take 10 mg by mouth daily.    Marland Kitchen lisinopril (PRINIVIL,ZESTRIL) 20 MG tablet Take 20 mg by mouth daily.    . meloxicam (MOBIC) 15 MG tablet Take 7.5 mg by mouth daily.    . meloxicam (MOBIC) 15 MG tablet Take 15 mg by mouth daily.    . metFORMIN (GLUCOPHAGE) 500 MG tablet Take by mouth 2 (two) times daily with a meal.    . metFORMIN (GLUCOPHAGE) 500 MG tablet Take by mouth 2 (two) times daily with a meal.    . metoprolol tartrate (LOPRESSOR) 25 MG tablet Take 25 mg by mouth 2 (two) times daily.    . metoprolol tartrate (LOPRESSOR) 25 MG tablet Take 25 mg by mouth 2 (two) times daily.    Marland Kitchen omeprazole (PRILOSEC) 40 MG capsule Take 40 mg by mouth daily.    Marland Kitchen omeprazole (PRILOSEC) 40 MG capsule TK ONE C PO ONCE D B A MEAL  2  . traMADol (ULTRAM) 50 MG tablet Take by mouth every 6 (six) hours as needed.    . traMADol (ULTRAM) 50 MG tablet TK 1 T PO Q 6 TO 8 H PRF 30 DAYS  0   No current facility-administered medications for this visit.    Allergies  Allergen Reactions  . Asa [Aspirin]   . Bactrim [Sulfamethoxazole-Trimethoprim]   . Pork-Derived Products     D/t  religious beliefs  . Fish Allergy Rash  . Sulfamethoxazole-Trimethoprim Rash    REACTION: Rash     Exam:  BP 114/76   Pulse 68   Ht 5' 2.99" (1.6 m)   Wt 214 lb (97.1 kg)   SpO2 100%   BMI 37.92 kg/m  General: Well Developed, well nourished, and in no acute distress.  Neuro/Psych: Alert and oriented x3, extra-ocular muscles intact, able to move all 4 extremities, sensation grossly intact. Skin: Warm and dry, no rashes noted.  Respiratory: Not using accessory muscles, speaking in full sentences, trachea midline.  Cardiovascular: Pulses palpable, no extremity edema. Abdomen: Does not appear distended. MSK:  L-spine: Nontender to midline.  Tender to palpation right lumbar paraspinal muscles.  Decreased lumbar motion due to pain. Lower extremity strength is intact. Reflexes are intact bilateral legs.  Knees  bilaterally have mild effusion with no erythema. Range of motion 0-100 degrees with much patellar crepitations. Diffusely tender bilaterally. Stable ligamentous exam bilaterally. Intact flexion and extension strength bilaterally.  Procedure: Real-time Ultrasound Guided Injection of right knee Device: GE Logiq E   Images permanently stored and available for review in the ultrasound unit. Verbal informed consent obtained.  Discussed risks and benefits of procedure. Warned about infection bleeding damage to structures skin hypopigmentation and fat atrophy among others. Patient expresses understanding and agreement Time-out conducted.   Noted no overlying erythema, induration, or other signs of local infection.   Skin prepped in a sterile fashion.   Local anesthesia: Topical Ethyl chloride.   With sterile technique and under real time ultrasound guidance:  40 mg of Kenalog and 4 mL of Marcaine injected easily.   Completed without difficulty   Pain immediately resolved suggesting accurate placement of the medication.   Advised to call if fevers/chills, erythema, induration, drainage, or persistent bleeding.   Images permanently stored and available for review in the ultrasound unit.  Impression: Technically successful ultrasound guided injection.    Procedure: Real-time Ultrasound Guided Injection of left knee Device: GE Logiq E   Images permanently stored and available for review in the ultrasound unit. Verbal informed consent obtained.  Discussed risks and benefits of procedure. Warned about infection bleeding damage to structures skin hypopigmentation and fat atrophy among others. Patient expresses understanding and agreement Time-out conducted.   Noted no overlying erythema, induration, or other signs of local infection.   Skin prepped in a sterile fashion.   Local anesthesia: Topical Ethyl chloride.   With sterile technique and under real time ultrasound guidance:  40 mg of Kenalog  and 4 mL of Marcaine injected easily.   Completed without difficulty   Pain immediately resolved suggesting accurate placement of the medication.   Advised to call if fevers/chills, erythema, induration, drainage, or persistent bleeding.   Images permanently stored and available for review in the ultrasound unit.  Impression: Technically successful ultrasound guided injection.  Right knee x-ray: Severe medial compartment DJD.  Moderate lateral compartment DJD.  Moderate retropatellar DJD.  No acute findings.  Left knee x-ray: Moderate medial compartment DJD.  Moderate lateral compartment DJD.  Moderate retropatellar DJD.  No acute findings.  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  Degenerative disc disease at L4-L5.  No acute findings.  All x-ray above findings are my personal interpretation of the images. Awaiting formal radiology review    Assessment and Plan: 52 y.o. female with  Knee pain bilaterally due to DJD.  Status post joint injection today.  Recheck in a few weeks.  Attend physical  therapy.  Lumbar pain: Myofascial strain versus degenerative disc disease.  Patient will benefit from physical therapy which has been already arranged.  Patient does have some left S1 nerve root symptoms.  Consider MRI in the near future for potential epidural steroid injection planning and further evaluation of pain.    Orders Placed This Encounter  Procedures  . DG Lumbar Spine Complete    Standing Status:   Future    Number of Occurrences:   1    Standing Expiration Date:   03/19/2019    Order Specific Question:   Reason for Exam (SYMPTOM  OR DIAGNOSIS REQUIRED)    Answer:   eval pain right lspine and left leg pain likely S1    Order Specific Question:   Is patient pregnant?    Answer:   No    Order Specific Question:   Preferred imaging location?    Answer:   [facility]    Order Specific Question:   Radiology Contrast Protocol - do NOT remove file path    Answer:    [email or web address].pdf  . DG Knee Complete 4 Views Left    Please include patellar sunrise, lateral, and weightbearing bilateral AP and bilateral rosenberg views    Standing Status:   Future    Number of Occurrences:   1    Standing Expiration Date:   03/18/2019    Order Specific Question:   Reason for exam:    Answer:   Please include patellar sunrise, lateral, and weightbearing bilateral AP and bilateral rosenberg views    Comments:   Please include patellar sunrise, lateral, and weightbearing bilateral AP and bilateral rosenberg views    Order Specific Question:   Preferred imaging location?    Answer:   [facility]  . DG Knee Complete 4 Views Right    Please include patellar sunrise, lateral, and weightbearing bilateral AP and bilateral rosenberg views    Standing Status:   Future    Number of Occurrences:   1    Standing Expiration Date:   03/18/2019    Order Specific Question:   Reason for exam:    Answer:   Please include patellar sunrise, lateral, and weightbearing bilateral AP and bilateral rosenberg views    Comments:   Please include patellar sunrise, lateral, and weightbearing bilateral AP and bilateral rosenberg views    Order Specific Question:   Preferred imaging location?    Answer:   [facility]   No orders of the defined types were placed in this encounter.   Discussed warning signs or symptoms. Please see discharge instructions. Patient expresses understanding.

## 2018-01-16 NOTE — Patient Instructions (Addendum)
Thank you for coming in today. Call or go to the ER if you develop a large red swollen joint with extreme pain or oozing puss.  Get xray.  Attend PT.  Recheck in 3-4 weeks.  Likely we will do a MRI.   I will call  Philomene Nijimbere  [phone]   with results.

## 2018-02-06 ENCOUNTER — Ambulatory Visit: Payer: Medicaid Other | Admitting: Family Medicine

## 2018-02-06 DIAGNOSIS — Z0189 Encounter for other specified special examinations: Secondary | ICD-10-CM

## 2018-02-12 ENCOUNTER — Telehealth: Payer: Self-pay | Admitting: Family Medicine

## 2018-02-12 ENCOUNTER — Encounter: Payer: Self-pay | Admitting: Family Medicine

## 2018-02-12 ENCOUNTER — Ambulatory Visit (INDEPENDENT_AMBULATORY_CARE_PROVIDER_SITE_OTHER): Payer: Medicaid Other | Admitting: Family Medicine

## 2018-02-12 DIAGNOSIS — M170 Bilateral primary osteoarthritis of knee: Secondary | ICD-10-CM

## 2018-02-12 DIAGNOSIS — M179 Osteoarthritis of knee, unspecified: Secondary | ICD-10-CM | POA: Insufficient documentation

## 2018-02-12 DIAGNOSIS — M1710 Unilateral primary osteoarthritis, unspecified knee: Secondary | ICD-10-CM | POA: Insufficient documentation

## 2018-02-12 NOTE — Telephone Encounter (Signed)
Information has been sent to [facility] and awaiting determination.

## 2018-02-12 NOTE — Progress Notes (Signed)
Jenny Nelson is a 52 y.o. female who presents to [facility] today for knee pain and low back pain.  Jenny Nelson has a history of bilateral knee pain and low back pain.  She was seen in April 3 for knee pain where she received bilateral steroid injections.  This provided significant pain relief.  She notes the pain relief was most effective for the first several weeks and is now starting to wear off.  She notes she has less pain today than she did a month ago but the pain is returning.  She denies any radiating pain weakness or numbness fevers or chills.  She notes that during the interval she was applied for and was eligible for Medicaid.   Low back pain.  She notes continued low back pain which is improved from where it was several months ago.  She notes chronic pain rated moderate intensity worse with activity better with rest.   Past Medical History:  Diagnosis Date  . Asthma   . Cough   . Diabetes (HCC)   . Diabetic neuropathy (HCC)   . GERD (gastroesophageal reflux disease) 12/19/2017  . Hypertension   . Hypertension associated with diabetes (HCC) 12/19/2017  . Lung nodule    Past Surgical History:  Procedure Laterality Date  . CESAREAN SECTION     Social History   Tobacco Use  . Smoking status: Never Smoker  . Smokeless tobacco: Never Used  Substance Use Topics  . Alcohol use: No     ROS:  As above   Medications: Current Outpatient Medications  Medication Sig Dispense Refill  . albuterol (PROVENTIL HFA;VENTOLIN HFA) 108 (90 Base) MCG/ACT inhaler Inhale into the lungs every 6 (six) hours as needed for wheezing or shortness of breath.    Marland Kitchen atorvastatin (LIPITOR) 20 MG tablet Take 20 mg by mouth daily.    . cyclobenzaprine (FLEXERIL) 10 MG tablet Take 1 tablet (10 mg total) by mouth 3 (three) times daily as needed for muscle spasms. 30 tablet 0  . Fluticasone-Salmeterol (ADVAIR) 250-50 MCG/DOSE AEPB Inhale 1 puff into the  lungs 2 (two) times daily.    Marland Kitchen gabapentin (NEURONTIN) 300 MG capsule Take 300 mg by mouth 3 (three) times daily.    Marland Kitchen lisinopril (PRINIVIL,ZESTRIL) 20 MG tablet Take 20 mg by mouth daily.    . meloxicam (MOBIC) 15 MG tablet Take 15 mg by mouth daily.    . metFORMIN (GLUCOPHAGE) 500 MG tablet Take by mouth 2 (two) times daily with a meal.    . metoprolol tartrate (LOPRESSOR) 25 MG tablet Take 25 mg by mouth 2 (two) times daily.    Marland Kitchen omeprazole (PRILOSEC) 40 MG capsule TK ONE C PO ONCE D B A MEAL  2  . traMADol (ULTRAM) 50 MG tablet TK 1 T PO Q 6 TO 8 H PRF 30 DAYS  0   No current facility-administered medications for this visit.    Allergies  Allergen Reactions  . Asa [Aspirin]   . Bactrim [Sulfamethoxazole-Trimethoprim]   . Pork-Derived Products     D/t religious beliefs  . Fish Allergy Rash  . Sulfamethoxazole-Trimethoprim Rash    REACTION: Rash     Exam:  BP 102/70   Pulse (!) 35   Wt 208 lb (94.3 kg)   BMI 36.85 kg/m  General: Well Developed, well nourished, and in no acute distress.  Neuro/Psych: Alert and oriented x3, extra-ocular muscles intact, able to move all 4 extremities, sensation grossly intact.  Skin: Warm and dry, no rashes noted.  Respiratory: Not using accessory muscles, speaking in full sentences, trachea midline.  Cardiovascular: Pulses palpable, no extremity edema. Abdomen: Does not appear distended. MSK:  Knees bilaterally without effusion.  Mild antalgic gait.  EXAM: RIGHT KNEE - COMPLETE 4+ VIEW  COMPARISON:  None.  FINDINGS: No evidence of fracture, dislocation, or joint effusion. Moderate narrowing of medial joint space is noted. Soft tissues are unremarkable.  IMPRESSION: Moderate degenerative joint disease is noted medially. No acute abnormality seen in the right knee.   Electronically Signed   By: Lupita Raider, M.D.   On: 01/16/2018 15:44  EXAM: LEFT KNEE - COMPLETE 4+ VIEW  COMPARISON:  Radiographs of October 02, 2017.  FINDINGS: No evidence of fracture, dislocation, or joint effusion. Moderate narrowing of medial joint space is noted. Soft tissues are unremarkable.  IMPRESSION: Moderate degenerative joint disease is noted medially. No acute abnormality seen in the left knee.   Electronically Signed   By: Lupita Raider, M.D.   On: 01/16/2018 15:43 I personally (independently) visualized and performed the interpretation of the images attached in this note.    Assessment and Plan: 52 y.o. female with  Knee pain due to DJD.  Patient with limited response to steroid injection.  She is a reasonably good candidate for trial of hyaluronic acid.  Work on prior authorizing this with Medicaid.  That falls through consider patient assistance program through [employer].  Recheck as needed.   Discussed warning signs or symptoms. Please see discharge instructions. Patient expresses understanding.  I spent 15 minutes with this patient, greater than 50% was face-to-face time counseling regarding ddx and treatment plan.

## 2018-02-12 NOTE — Patient Instructions (Addendum)
Thank you for coming in today. You should hear about the medicine for the knee shots soon.  Let me know if you do not hear anything.   Sodium Hyaluronate intra-articular injection What is this medicine? SODIUM HYALURONATE (SOE dee um hye al yoor ON ate) is used to treat pain in the knee due to osteoarthritis. This medicine may be used for other purposes; ask your health care provider or pharmacist if you have questions. COMMON BRAND NAME(S): Amvisc, DUROLANE, Euflexxa, GELSYN-3, Hyalgan, Monovisc, Orthovisc, Supartz, Supartz FX What should I tell my health care provider before I take this medicine? They need to know if you have any of these conditions: -bleeding disorders -glaucoma -infection in the knee joint -skin conditions or sensitivity -skin infection -an unusual allergic reaction to sodium hyaluronate, other medicines, foods, dyes, or preservatives. Different brands of sodium hyaluronate contain different allergens. Some may contain egg. Talk to your doctor about your allergies to make sure that you get the right product. -pregnant or trying to get pregnant -breast-feeding How should I use this medicine? This medicine is for injection into the knee joint. It is given by a health care professional in a hospital or clinic setting. Talk to your pediatrician regarding the use of this medicine in children. Special care may be needed. Overdosage: If you think you have taken too much of this medicine contact a poison control center or emergency room at once. NOTE: This medicine is only for you. Do not share this medicine with others. What if I miss a dose? This does not apply. What may interact with this medicine? Interactions are not expected. This list may not describe all possible interactions. Give your health care provider a list of all the medicines, herbs, non-prescription drugs, or dietary supplements you use. Also tell them if you smoke, drink alcohol, or use illegal drugs. Some  items may interact with your medicine. What should I watch for while using this medicine? Tell your doctor or healthcare professional if your symptoms do not start to get better or if they get worse. If receiving this medicine for osteoarthritis, limit your activity after you receive your injection. Avoid physical activity for 48 hours following your injection to keep your knee from swelling. Do not stand on your feet for more than 1 hour at a time during the first 48 hours following your injection. Ask your doctor or healthcare professional about when you can begin major physical activity again. What side effects may I notice from receiving this medicine? Side effects that you should report to your doctor or health care professional as soon as possible: -allergic reactions like skin rash, itching or hives, swelling of the face, lips, or tongue -dizziness -facial flushing -pain, tingling, numbness in the hands or feet -vision changes if received this medicine during eye surgery Side effects that usually do not require medical attention (report to your doctor or health care professional if they continue or are bothersome): -back pain -bruising at site where injected -chills -diarrhea -fever -headache -joint pain -joint stiffness -joint swelling -muscle cramps -muscle pain -nausea, vomiting -pain, redness, or irritation at site where injected -weak or tired This list may not describe all possible side effects. Call your doctor for medical advice about side effects. You may report side effects to FDA at 1-800-FDA-1088. Where should I keep my medicine? This drug is given in a hospital or clinic and will not be stored at home. NOTE: This sheet is a summary. It may not cover all possible  information. If you have questions about this medicine, talk to your doctor, pharmacist, or health care provider.  2018 Elsevier/Gold Standard (2015-11-04 08:34:51)

## 2018-02-18 NOTE — Telephone Encounter (Signed)
Received a fax from [employer] that was requesting a copy of patient's insurance card and it has been faxed to [employer].

## 2018-02-19 NOTE — Telephone Encounter (Signed)
Placed assistance paperwork in the mail to be sent out to the patient.   I also left a message for the patients daughter to call back.

## 2018-02-19 NOTE — Telephone Encounter (Signed)
We will work on [employer] though Psychologist, counselling.

## 2018-02-19 NOTE — Telephone Encounter (Signed)
Received a letter from [facility] and [facility] that  speciality injections are not covered for this patient since she is on Medicaid. Please advise.

## 2018-02-20 NOTE — Telephone Encounter (Signed)
Left detailed message for patients daughter about the patient assistance form that has been mailed to the patients home. Patients daughter was asked to call back with any questions. Closing encounter since I have not received a call back from the patients daughter.

## 2018-08-02 ENCOUNTER — Ambulatory Visit: Payer: Self-pay | Admitting: Family Medicine

## 2018-08-06 ENCOUNTER — Ambulatory Visit (INDEPENDENT_AMBULATORY_CARE_PROVIDER_SITE_OTHER): Payer: Medicaid Other | Admitting: Family Medicine

## 2018-08-06 ENCOUNTER — Ambulatory Visit (INDEPENDENT_AMBULATORY_CARE_PROVIDER_SITE_OTHER): Payer: Medicaid Other

## 2018-08-06 VITALS — BP 134/71 | HR 53 | Wt 210.0 lb

## 2018-08-06 DIAGNOSIS — M170 Bilateral primary osteoarthritis of knee: Secondary | ICD-10-CM | POA: Diagnosis not present

## 2018-08-06 DIAGNOSIS — M25572 Pain in left ankle and joints of left foot: Secondary | ICD-10-CM

## 2018-08-06 DIAGNOSIS — M19072 Primary osteoarthritis, left ankle and foot: Secondary | ICD-10-CM | POA: Diagnosis not present

## 2018-08-06 DIAGNOSIS — M25561 Pain in right knee: Secondary | ICD-10-CM

## 2018-08-06 DIAGNOSIS — Z23 Encounter for immunization: Secondary | ICD-10-CM

## 2018-08-06 DIAGNOSIS — M79672 Pain in left foot: Secondary | ICD-10-CM | POA: Diagnosis not present

## 2018-08-06 MED ORDER — DICLOFENAC SODIUM 1 % TD GEL: 4.0000 g | Freq: Four times a day (QID) | TRANSDERMAL | 11 refills | Status: AC

## 2018-08-06 NOTE — Patient Instructions (Addendum)
Thank you for coming in today. Apply the diclofenac gel 4x daily for knee pain.  Return in 1 week for knee injection left side.  Get xray of the left foot and ankle now. We will talk about foot and ankle xray and pain in 1 week.  Return sooner if needed.   Call or go to the ER if you develop a large red swollen joint with extreme pain or oozing puss.

## 2018-08-06 NOTE — Progress Notes (Signed)
Jenny Nelson is a 52 y.o. female who presents to [facility] today for bilateral knee pain and left ankle pain.  Jenny Nelson notes bilateral knee pain right worse than left.  She was last seen for this issue in March and April where she received a steroid injection.  She notes the injections helped some but not sufficiently to control her pain fully.  She has been using over-the-counter medicines for pain which have helped a little.  She denies any radiating pain weakness or numbness.  She does note some knee swelling..  She notes some locking grinding as well worse with activity.  Notes that the last visit her blood sugar was elevated following injection for a few days.  Additionally she notes left ankle and foot pain.  She notes pain in the lateral ankle and dorsal foot.  This is been ongoing for years and slowly worsening.  She like to address this issue as well.  She denies any recent injury.  Again she is tried some over-the-counter medications for pain which helps a little.   ROS as above:  Exam:  BP 134/71   Pulse (!) 53   Wt 210 lb (95.3 kg)   BMI 37.21 kg/m  Wt Readings from Last 5 Encounters:  08/06/18 210 lb (95.3 kg)  02/12/18 208 lb (94.3 kg)  01/16/18 214 lb (97.1 kg)  12/19/17 214 lb (97.1 kg)  11/24/16 219 lb 9.6 oz (99.6 kg)    Gen: Well NAD HEENT: EOMI,  MMM Lungs: Normal work of breathing. CTABL Heart: RRR no MRG Abd: NABS, Soft. Nondistended, Nontender Exts: Brisk capillary refill, warm and well perfused.  Right knee: Relatively normal-appearing without significant effusion. Mildly tender to palpation medial joint line.  Range of motion 0-120 degrees with mild crepitations. Stable ligamentous exam.  Left knee relatively normal-appearing without large effusion. Mildly tender to palpation medial joint line. Range of motion 0-120 degrees  with mild crepitations.  Stable ligamentous exam.  Left ankle: Slightly swollen at the lateral joint line.  Normal ankle motion mildly tender ATFL area. Left foot some pain and swelling at the dorsal midfoot.  Mildly tender in this area.  Normal foot motion.  Pulses capillary refill and sensation are intact distally.  Lab and Radiology Results X-ray images personally independently reviewed left foot and ankle Mild degenerative changes present at the medial ankle and dorsal midfoot.  No acute fractures present.  No severe degenerative changes. Await formal radiology review.  Procedure: Real-time Ultrasound Guided Injection of right knee  Device: GE Logiq E   Images permanently stored and available for review in the ultrasound unit. Verbal informed consent obtained.  Discussed risks and benefits of procedure. Warned about infection bleeding damage to structures skin hypopigmentation and fat atrophy among others. Patient expresses understanding and agreement Time-out conducted.   Noted no overlying erythema, induration, or other signs of local infection.   Skin prepped in a sterile fashion.   Local anesthesia: Topical Ethyl chloride.   With sterile technique and under real time ultrasound guidance:  40mg  depomedrol and 4ml marcaine injected easily.   Completed without difficulty   Pain immediately resolved suggesting accurate placement of the medication.   Advised to call if fevers/chills, erythema, induration, drainage, or persistent bleeding.   Images permanently stored and available for review in the ultrasound unit.  Impression: Technically successful ultrasound guided injection.      Assessment and Plan: 52 y.o. 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  Plan for unilateral right knee steroid injection.  Will avoid double injection at the same time to reduce risk of significant hyperglycemia.  Recheck in 1 week we will proceed with left knee injection.  Additionally  patient has left ankle and foot pain.  Mild degenerative changes on x-ray per my read today.  Await formal radiology review.  Proceed with diclofenac gel for both knee DJD as well as ankle and foot pain.   Orders Placed This Encounter  Procedures  . DG Foot Complete Left    Standing Status:   Future    Number of Occurrences:   1    Standing Expiration Date:   10/07/2019    Order Specific Question:   Reason for Exam (SYMPTOM  OR DIAGNOSIS REQUIRED)    Answer:   pain left foot and ankle x years    Order Specific Question:   Is patient pregnant?    Answer:   No    Order Specific Question:   Preferred imaging location?    Answer:   [facility]    Order Specific Question:   Radiology Contrast Protocol - do NOT remove file path    Answer:   \\[email or web address]\DXFluoroContrastProtocols.pdf  . DG Ankle Complete Left    Standing Status:   Future    Number of Occurrences:   1    Standing Expiration Date:   10/07/2019    Order Specific Question:   Reason for Exam (SYMPTOM  OR DIAGNOSIS REQUIRED)    Answer:   pain left foot and ankle x years    Order Specific Question:   Is patient pregnant?    Answer:   No    Order Specific Question:   Preferred imaging location?    Answer:   [facility]    Order Specific Question:   Radiology Contrast Protocol - do NOT remove file path    Answer:   \\[email or web address]\Radiant\DXFluoroContrastProtocols.pdf  . Flu Vaccine QUAD 36+ mos IM   Meds ordered this encounter  Medications  . diclofenac sodium (VOLTAREN) 1 % GEL    Sig: Apply 4 g topically 4 (four) times daily. To affected joint.    Dispense:  100 g    Refill:  11    Knee OA. Tried and failed ibuprofen, naproxen and meloxicam     Historical information moved to improve visibility of documentation.  Past Medical History:  Diagnosis Date  . Asthma   . Cough   . Diabetes (HCC)   . Diabetic neuropathy (HCC)   . GERD (gastroesophageal reflux disease) 12/19/2017    . Hypertension   . Hypertension associated with diabetes (HCC) 12/19/2017  . Lung nodule    Past Surgical History:  Procedure Laterality Date  . CESAREAN SECTION     Social History   Tobacco Use  . Smoking status: Never Smoker  . Smokeless tobacco: Never Used  Substance Use Topics  . Alcohol use: No   family history is not on file.  Medications: Current Outpatient Medications  Medication Sig Dispense Refill  . albuterol (PROVENTIL HFA;VENTOLIN HFA) 108 (90 Base) MCG/ACT inhaler Inhale into the lungs every 6 (six) hours as needed for wheezing or shortness of breath.    Marland Kitchen atorvastatin (LIPITOR) 20 MG tablet Take 20 mg by mouth daily.    . cyclobenzaprine (FLEXERIL) 10 MG tablet Take 1 tablet (10 mg total) by mouth 3 (three) times daily as needed for muscle spasms. 30 tablet 0  . Fluticasone-Salmeterol (ADVAIR)  250-50 MCG/DOSE AEPB Inhale 1 puff into the lungs 2 (two) times daily.    Marland Kitchen gabapentin (NEURONTIN) 300 MG capsule Take 300 mg by mouth 3 (three) times daily.    Marland Kitchen lisinopril (PRINIVIL,ZESTRIL) 20 MG tablet Take 20 mg by mouth daily.    . meloxicam (MOBIC) 15 MG tablet Take 15 mg by mouth daily.    . metFORMIN (GLUCOPHAGE) 500 MG tablet Take by mouth 2 (two) times daily with a meal.    . metoprolol tartrate (LOPRESSOR) 25 MG tablet Take 25 mg by mouth 2 (two) times daily.    Marland Kitchen omeprazole (PRILOSEC) 40 MG capsule TK ONE C PO ONCE D B A MEAL  2  . traMADol (ULTRAM) 50 MG tablet TK 1 T PO Q 6 TO 8 H PRF 30 DAYS  0  . diclofenac sodium (VOLTAREN) 1 % GEL Apply 4 g topically 4 (four) times daily. To affected joint. 100 g 11   No current facility-administered medications for this visit.    Allergies  Allergen Reactions  . Asa [Aspirin]   . Bactrim [Sulfamethoxazole-Trimethoprim]   . Pork-Derived Products     D/t religious beliefs  . Fish Allergy Rash  . Sulfamethoxazole-Trimethoprim Rash    REACTION: Rash     Discussed warning signs or symptoms. Please see discharge  instructions. Patient expresses understanding.

## 2018-08-13 ENCOUNTER — Encounter: Payer: Self-pay | Admitting: Family Medicine

## 2018-08-13 ENCOUNTER — Ambulatory Visit (INDEPENDENT_AMBULATORY_CARE_PROVIDER_SITE_OTHER): Payer: Medicaid Other | Admitting: Family Medicine

## 2018-08-13 VITALS — BP 135/68 | HR 67 | Wt 207.0 lb

## 2018-08-13 DIAGNOSIS — M170 Bilateral primary osteoarthritis of knee: Secondary | ICD-10-CM

## 2018-08-13 NOTE — Progress Notes (Signed)
Jenny Nelson is a 52 y.o. female who presents to [facility] today for follow up knee pain BL.  [name] was seen in 1 week ago for knee pain. She had right knee steroid injection and notes that her right knee significantly improved following steroid injection however the injection caused elevated blood sugars. She notes that her left knee continues to bother her. She notes that she has had a few episodes of low blood sugars as well where she felt jittery.   Additionally [name] was unable to get the diclofenac gel since the last visit because she did not have $3 for the Medicaid copayment.  Her son mentions also that they have been attempting to apply for disability.  [name] has not worked in 10 years due to her knee and ankle pain.  She has difficulty with mobility and household tasks including cooking and cleaning.    ROS:  As above  Exam:  BP 135/68   Pulse 67   Wt 207 lb (93.9 kg)   BMI 36.68 kg/m  General: Well Developed, well nourished, and in no acute distress.  Neuro/Psych: Alert and oriented x3, extra-ocular muscles intact, able to move all 4 extremities, sensation grossly intact. Skin: Warm and dry, no rashes noted.  Respiratory: Not using accessory muscles, speaking in full sentences, trachea midline.  Cardiovascular: Pulses palpable, no extremity edema. Abdomen: Does not appear distended. MSK:  Knees bilaterally relatively normal-appearing with no deformity. Patient ambulates with a cane with an antalgic gait.       Assessment and Plan: 52 y.o. female with  Knee pain bilaterally right knee pain significant improvement status post injection left knee pain still quite bothersome.  Work on getting the diclofenac gel for left knee and ankle which should help.  We will delay the next steroid injection a bit as she still having fluctuating blood sugars from the steroid use a week ago.  Plan to recheck in 4 weeks.   Letter written to help with disability application.  I do believe that Jenny Nelson is disabled.    No orders of the defined types were placed in this encounter.  No orders of the defined types were placed in this encounter.   Historical information moved to improve visibility of documentation.  Past Medical History:  Diagnosis Date  . Asthma   . Cough   . Diabetes (HCC)   . Diabetic neuropathy (HCC)   . GERD (gastroesophageal reflux disease) 12/19/2017  . Hypertension   . Hypertension associated with diabetes (HCC) 12/19/2017  . Lung nodule    Past Surgical History:  Procedure Laterality Date  . CESAREAN SECTION     Social History   Tobacco Use  . Smoking status: Never Smoker  . Smokeless tobacco: Never Used  Substance Use Topics  . Alcohol use: No   family history is not on file.  Medications: Current Outpatient Medications  Medication Sig Dispense Refill  . albuterol (PROVENTIL HFA;VENTOLIN HFA) 108 (90 Base) MCG/ACT inhaler Inhale into the lungs every 6 (six) hours as needed for wheezing or shortness of breath.    Marland Kitchen atorvastatin (LIPITOR) 20 MG tablet Take 20 mg by mouth daily.    . cyclobenzaprine (FLEXERIL) 10 MG tablet Take 1 tablet (10 mg total) by mouth 3 (three) times daily as needed for muscle spasms. 30 tablet 0  . diclofenac sodium (VOLTAREN) 1 % GEL Apply 4 g topically 4 (four) times daily. To affected joint. 100 g 11  . Fluticasone-Salmeterol (  ADVAIR) 250-50 MCG/DOSE AEPB Inhale 1 puff into the lungs 2 (two) times daily.    Marland Kitchen gabapentin (NEURONTIN) 300 MG capsule Take 300 mg by mouth 3 (three) times daily.    Marland Kitchen lisinopril (PRINIVIL,ZESTRIL) 20 MG tablet Take 20 mg by mouth daily.    . meloxicam (MOBIC) 15 MG tablet Take 15 mg by mouth daily.    . metFORMIN (GLUCOPHAGE) 500 MG tablet Take by mouth 2 (two) times daily with a meal.    . metoprolol tartrate (LOPRESSOR) 25 MG tablet Take 25 mg by mouth 2 (two) times daily.    Marland Kitchen omeprazole (PRILOSEC) 40 MG capsule TK  ONE C PO ONCE D B A MEAL  2  . traMADol (ULTRAM) 50 MG tablet TK 1 T PO Q 6 TO 8 H PRF 30 DAYS  0   No current facility-administered medications for this visit.    Allergies  Allergen Reactions  . Asa [Aspirin]   . Bactrim [Sulfamethoxazole-Trimethoprim]   . Pork-Derived Products     D/t religious beliefs  . Fish Allergy Rash  . Sulfamethoxazole-Trimethoprim Rash    REACTION: Rash      Discussed warning signs or symptoms. Please see discharge instructions. Patient expresses understanding.

## 2018-08-13 NOTE — Patient Instructions (Addendum)
I am happy to help fill out any form. Return in 1 month and we will do a left knee injection.   Return sooner if needed.

## 2018-09-03 ENCOUNTER — Ambulatory Visit: Payer: Self-pay | Admitting: Family Medicine

## 2018-09-11 ENCOUNTER — Ambulatory Visit: Payer: Self-pay | Admitting: Family Medicine

## 2018-10-23 ENCOUNTER — Encounter: Payer: Self-pay | Admitting: Family Medicine

## 2018-10-23 ENCOUNTER — Ambulatory Visit (INDEPENDENT_AMBULATORY_CARE_PROVIDER_SITE_OTHER): Payer: Medicaid Other | Admitting: Family Medicine

## 2018-10-23 VITALS — BP 132/78 | HR 64 | Wt 218.0 lb

## 2018-10-23 DIAGNOSIS — M25561 Pain in right knee: Secondary | ICD-10-CM

## 2018-10-23 DIAGNOSIS — M170 Bilateral primary osteoarthritis of knee: Secondary | ICD-10-CM | POA: Diagnosis not present

## 2018-10-23 NOTE — Patient Instructions (Signed)
Thank you for coming in today. Call or go to the ER if you develop a large red swollen joint with extreme pain or oozing puss.   Recheck in 1-3 weeks for left knee injection if needed.

## 2018-10-23 NOTE — Progress Notes (Signed)
Jenny Nelson is a 53 y.o. female who presents to [facility] today for bilateral knee pain.  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  Her right knee hurts worse than her left.  She has been seen several times for this and has been treated with conservative management and diclofenac gel quad strengthening and trials of weight loss.  Additionally she is received serial steroid injections which have provided some benefit.  Her last visit was August 13, 2018 where she received a right knee injection.  She notes this provided pain relief for approximately 2 months.  Over the last month or so the pain is slowly returned.  She notes pain is bothersome rating her symptoms as moderate.  She notes the pain interferes with normal walking and she is using a cane at times.  She is reluctant to consider a total knee replacement fearing side effects.  She notes with previous steroid injection she has had elevated blood sugars as result of her diabetes and steroids.  ROS:  As above  Exam:  BP 132/78   Pulse 64   Wt 218 lb (98.9 kg)   BMI 38.63 kg/m  Wt Readings from Last 5 Encounters:  10/23/18 218 lb (98.9 kg)  08/13/18 207 lb (93.9 kg)  08/06/18 210 lb (95.3 kg)  02/12/18 208 lb (94.3 kg)  01/16/18 214 lb (97.1 kg)   General: Well Developed, well nourished, and in no acute distress.  Neuro/Psych: Alert and oriented x3, extra-ocular muscles intact, able to move all 4 extremities, sensation grossly intact. Skin: Warm and dry, no rashes noted.  Respiratory: Not using accessory muscles, speaking in full sentences, trachea midline.  Cardiovascular: Pulses palpable, no extremity edema. Abdomen: Does not appear distended. MSK:  Right knee: Moderate effusion otherwise normal-appearing Normal motion with crepitation. Tender palpation medial joint line. Stable ligamentous exam.  Left knee relatively  normal-appearing with mild effusion.  Normal range of motion with crepitations.  Tender palpation medial joint line.  Stable ligamentous exam.    Lab and Radiology Results Procedure: Real-time Ultrasound Guided Injection of right knee Device: GE Logiq E   Images permanently stored and available for review in the ultrasound unit. Verbal informed consent obtained.  Discussed risks and benefits of procedure. Warned about infection bleeding damage to structures skin hypopigmentation and fat atrophy among others. Patient expresses understanding and agreement Time-out conducted.   Noted no overlying erythema, induration, or other signs of local infection.   Skin prepped in a sterile fashion.   Local anesthesia: Topical Ethyl chloride.   With sterile technique and under real time ultrasound guidance:  Milligrams of Kenalog and 4 mL of Marcaine injected easily.   Completed without difficulty   Pain immediately resolved suggesting accurate placement of the medication.   Advised to call if fevers/chills, erythema, induration, drainage, or persistent bleeding.   Images permanently stored and available for review in the ultrasound unit.  Impression: Technically successful ultrasound guided injection.   EXAM: RIGHT KNEE - COMPLETE 4+ VIEW  COMPARISON:  None.  FINDINGS: No evidence of fracture, dislocation, or joint effusion. Moderate narrowing of medial joint space is noted. Soft tissues are unremarkable.  IMPRESSION: Moderate degenerative joint disease is noted medially. No acute abnormality seen in the right knee.   Electronically Signed   By: Lupita Raider, M.D.   On: 01/16/2018 15:44   EXAM: LEFT KNEE - COMPLETE 4+ VIEW  COMPARISON:  Radiographs of  October 02, 2017.  FINDINGS: No evidence of fracture, dislocation, or joint effusion. Moderate narrowing of medial joint space is noted. Soft tissues are unremarkable.  IMPRESSION: Moderate degenerative joint disease  is noted medially. No acute abnormality seen in the left knee.   Electronically Signed   By: Lupita RaiderJames  Green Jr, M.D.   On: 01/16/2018 15:43  I personally (independently) visualized and performed the interpretation of the images attached in this note.   Assessment and Plan: 53 y.o. female with  Bilateral knee pain due to degenerative changes.  X-ray in April 2019 showed near bone-on-bone degenerative changes especially in the medial compartments of her knees bilaterally.  She is having some benefit with trials of conservative management including diclofenac gel quad strengthening and serial steroid injections.  However I do not anticipate these treatments are going to last forever.  We spent some time discussing through a Swahili interpreter the expected outcome and next steps especially if steroid injections stop working.  The meantime plan to proceed with repeat right knee steroid injection.  Recheck in a few weeks for left knee injection.  I would like to give some time between injections to keep the total steroid dose low to avoid hyperglycemic episodes.   PDMP not reviewed this encounter. No orders of the defined types were placed in this encounter.  No orders of the defined types were placed in this encounter.   Historical information moved to improve visibility of documentation.  Past Medical History:  Diagnosis Date  . Asthma   . Cough   . Diabetes (HCC)   . Diabetic neuropathy (HCC)   . GERD (gastroesophageal reflux disease) 12/19/2017  . Hypertension   . Hypertension associated with diabetes (HCC) 12/19/2017  . Lung nodule    Past Surgical History:  Procedure Laterality Date  . CESAREAN SECTION     Social History   Tobacco Use  . Smoking status: Never Smoker  . Smokeless tobacco: Never Used  Substance Use Topics  . Alcohol use: No   family history is not on file.  Medications: Current Outpatient Medications  Medication Sig Dispense Refill  . albuterol  (PROVENTIL HFA;VENTOLIN HFA) 108 (90 Base) MCG/ACT inhaler Inhale into the lungs every 6 (six) hours as needed for wheezing or shortness of breath.    Marland Kitchen. atorvastatin (LIPITOR) 20 MG tablet Take 20 mg by mouth daily.    . cyclobenzaprine (FLEXERIL) 10 MG tablet Take 1 tablet (10 mg total) by mouth 3 (three) times daily as needed for muscle spasms. 30 tablet 0  . diclofenac sodium (VOLTAREN) 1 % GEL Apply 4 g topically 4 (four) times daily. To affected joint. 100 g 11  . Fluticasone-Salmeterol (ADVAIR) 250-50 MCG/DOSE AEPB Inhale 1 puff into the lungs 2 (two) times daily.    Marland Kitchen. gabapentin (NEURONTIN) 300 MG capsule Take 300 mg by mouth 3 (three) times daily.    Marland Kitchen. lisinopril (PRINIVIL,ZESTRIL) 20 MG tablet Take 20 mg by mouth daily.    . meloxicam (MOBIC) 15 MG tablet Take 15 mg by mouth daily.    . metFORMIN (GLUCOPHAGE) 500 MG tablet Take by mouth 2 (two) times daily with a meal.    . metoprolol tartrate (LOPRESSOR) 25 MG tablet Take 25 mg by mouth 2 (two) times daily.    Marland Kitchen. omeprazole (PRILOSEC) 40 MG capsule TK ONE C PO ONCE D B A MEAL  2  . traMADol (ULTRAM) 50 MG tablet TK 1 T PO Q 6 TO 8 H PRF 30 DAYS  0  No current facility-administered medications for this visit.    Allergies  Allergen Reactions  . Asa [Aspirin]   . Bactrim [Sulfamethoxazole-Trimethoprim]   . Pork-Derived Products     D/t religious beliefs  . Fish Allergy Rash  . Sulfamethoxazole-Trimethoprim Rash    REACTION: Rash      Discussed warning signs or symptoms. Please see discharge instructions. Patient expresses understanding.

## 2018-12-04 ENCOUNTER — Telehealth: Payer: Self-pay

## 2018-12-04 NOTE — Telephone Encounter (Signed)
Called patient [phone] on 12/03/2018 and 12/04/2018 to advise that paperwork was ready for pickup. Patient did not answer and I could not leave a vm due to patient not having one set up. Letter is up front for patient to pick up. Rhonda Cunningham,CMA

## 2019-06-17 ENCOUNTER — Ambulatory Visit (INDEPENDENT_AMBULATORY_CARE_PROVIDER_SITE_OTHER): Payer: Medicaid Other | Admitting: Orthopaedic Surgery

## 2019-06-17 ENCOUNTER — Other Ambulatory Visit: Payer: Self-pay

## 2019-06-17 ENCOUNTER — Ambulatory Visit (INDEPENDENT_AMBULATORY_CARE_PROVIDER_SITE_OTHER): Payer: Medicaid Other

## 2019-06-17 ENCOUNTER — Encounter: Payer: Self-pay | Admitting: Orthopaedic Surgery

## 2019-06-17 ENCOUNTER — Ambulatory Visit: Payer: Self-pay

## 2019-06-17 VITALS — Ht 63.0 in | Wt 205.0 lb

## 2019-06-17 DIAGNOSIS — M1712 Unilateral primary osteoarthritis, left knee: Secondary | ICD-10-CM | POA: Diagnosis not present

## 2019-06-17 DIAGNOSIS — M1711 Unilateral primary osteoarthritis, right knee: Secondary | ICD-10-CM | POA: Diagnosis not present

## 2019-06-17 NOTE — Progress Notes (Signed)
Office Visit Note   Patient: Jenny Nelson           Date of Birth: October 31, 1965           MRN: [id number] Visit Date: 06/17/2019              Requested by: Carmon Ginsberg, FNP [place] [employer],  [place] [address] PCP: Carmon Ginsberg, FNP   Assessment & Plan: Visit Diagnoses:  1. Primary osteoarthritis of left knee   2. Primary osteoarthritis of right knee     Plan: Impression is end-stage bilateral knee DJD.  She seems to be having significant difficulty with this.  She is also unsure about having a knee replacement.  For now she will try to lose weight and do strengthening in hopes of improving the pain.  She can call us back at any time if she has any problems with her knees.  We will see her back as needed.  Follow-Up Instructions: Return if symptoms worsen or fail to improve.   Orders:  Orders Placed This Encounter  Procedures  . XR KNEE 3 VIEW RIGHT  . XR KNEE 3 VIEW LEFT   No orders of the defined types were placed in this encounter.     Procedures: No procedures performed   Clinical Data: No additional findings.   Subjective: Chief Complaint  Patient presents with  . Right Knee - Pain  . Left Knee - Pain    Jenny Nelson is a 53 year old African female who comes in today with her interpreter for evaluation of bilateral knee pain worse on the left.  She has chronic pain and giving way that is worse with increased activity and walking.  She ambulates with a cane.  She has had multiple cortisone injections that have given her temporary relief but has also severely increased her sugars.  She has tried over-the-counter NSAIDs as well.  She denies any definite injuries.     Review of Systems  Constitutional: Negative.   HENT: Negative.   Eyes: Negative.   Respiratory: Negative.   Cardiovascular: Negative.   Endocrine: Negative.   Musculoskeletal: Negative.   Neurological: Negative.   Hematological: Negative.   Psychiatric/Behavioral:  Negative.   All other systems reviewed and are negative.    Objective: Vital Signs: Ht 5\' 3"  (1.6 m)   Wt 205 lb (93 kg)   BMI 36.31 kg/m   Physical Exam Vitals signs and nursing note reviewed.  Constitutional:      Appearance: She is well-developed.  HENT:     Head: Normocephalic and atraumatic.  Neck:     Musculoskeletal: Neck supple.  Pulmonary:     Effort: Pulmonary effort is normal.  Abdominal:     Palpations: Abdomen is soft.  Skin:    General: Skin is warm.     Capillary Refill: Capillary refill takes less than 2 seconds.  Neurological:     Mental Status: She is alert and oriented to person, place, and time.  Psychiatric:        Behavior: Behavior normal.        Thought Content: Thought content normal.        Judgment: Judgment normal.     Ortho Exam Bilateral knee exams show no joint effusion.  Slightly painful range of motion.  Collaterals and cruciates are stable.  Mild varus tibial bowing. Specialty Comments:  No specialty comments available.  Imaging: Xr Knee 3 View Left  Result Date: 06/17/2019 Moderately severe left knee DJD  Xr Knee 3 View Right  Result Date: 06/17/2019 Moderately severe right knee DJD    PMFS History: Patient Active Problem List   Diagnosis Date Noted  . Primary osteoarthritis of left knee 06/17/2019  . Primary osteoarthritis of right knee 06/17/2019  . DJD (degenerative joint disease) of knee 02/12/2018  . Lumbago with sciatica, left side 01/16/2018  . Hypertension associated with diabetes (HCC) 12/19/2017  . GERD (gastroesophageal reflux disease) 12/19/2017  . Influenzal bronchitis 11/24/2016  . Moderate persistent asthma, uncomplicated 10/27/2016  . Pleurisy 10/27/2016  . Nodule of right lung 10/27/2016  . Diabetes mellitus (HCC) 10/27/2016  . Neuropathy 10/27/2016  . Arthritis 10/27/2016  . Abdominal pain 10/27/2016  . CONSTIPATION 03/15/2010  . HYPERTENSION, BENIGN ESSENTIAL 11/29/2009  . NASOPHARYNGITIS, ACUTE  11/29/2009  . GASTROESOPHAGEAL REFLUX DISEASE 11/29/2009  . KNEE PAIN 11/29/2009   Past Medical History:  Diagnosis Date  . Asthma   . Cough   . Diabetes (HCC)   . Diabetic neuropathy (HCC)   . GERD (gastroesophageal reflux disease) 12/19/2017  . Hypertension   . Hypertension associated with diabetes (HCC) 12/19/2017  . Lung nodule     Family History  Problem Relation Age of Onset  . Diabetes Neg Hx   . Hypertension Neg Hx   . Lung disease Neg Hx     Past Surgical History:  Procedure Laterality Date  . CESAREAN SECTION     Social History   Occupational History  . Not on file  Tobacco Use  . Smoking status: Never Smoker  . Smokeless tobacco: Never Used  Substance and Sexual Activity  . Alcohol use: No  . Drug use: No  . Sexual activity: Not on file

## 2019-10-28 IMAGING — DX DG KNEE COMPLETE 4+V*R*
4 series · 4 of 4 positions shown · non-contrast
Comparison: None.

CLINICAL DATA: Chronic right knee pain.

EXAM:
RIGHT KNEE - COMPLETE 4+ VIEW

[knee ap]
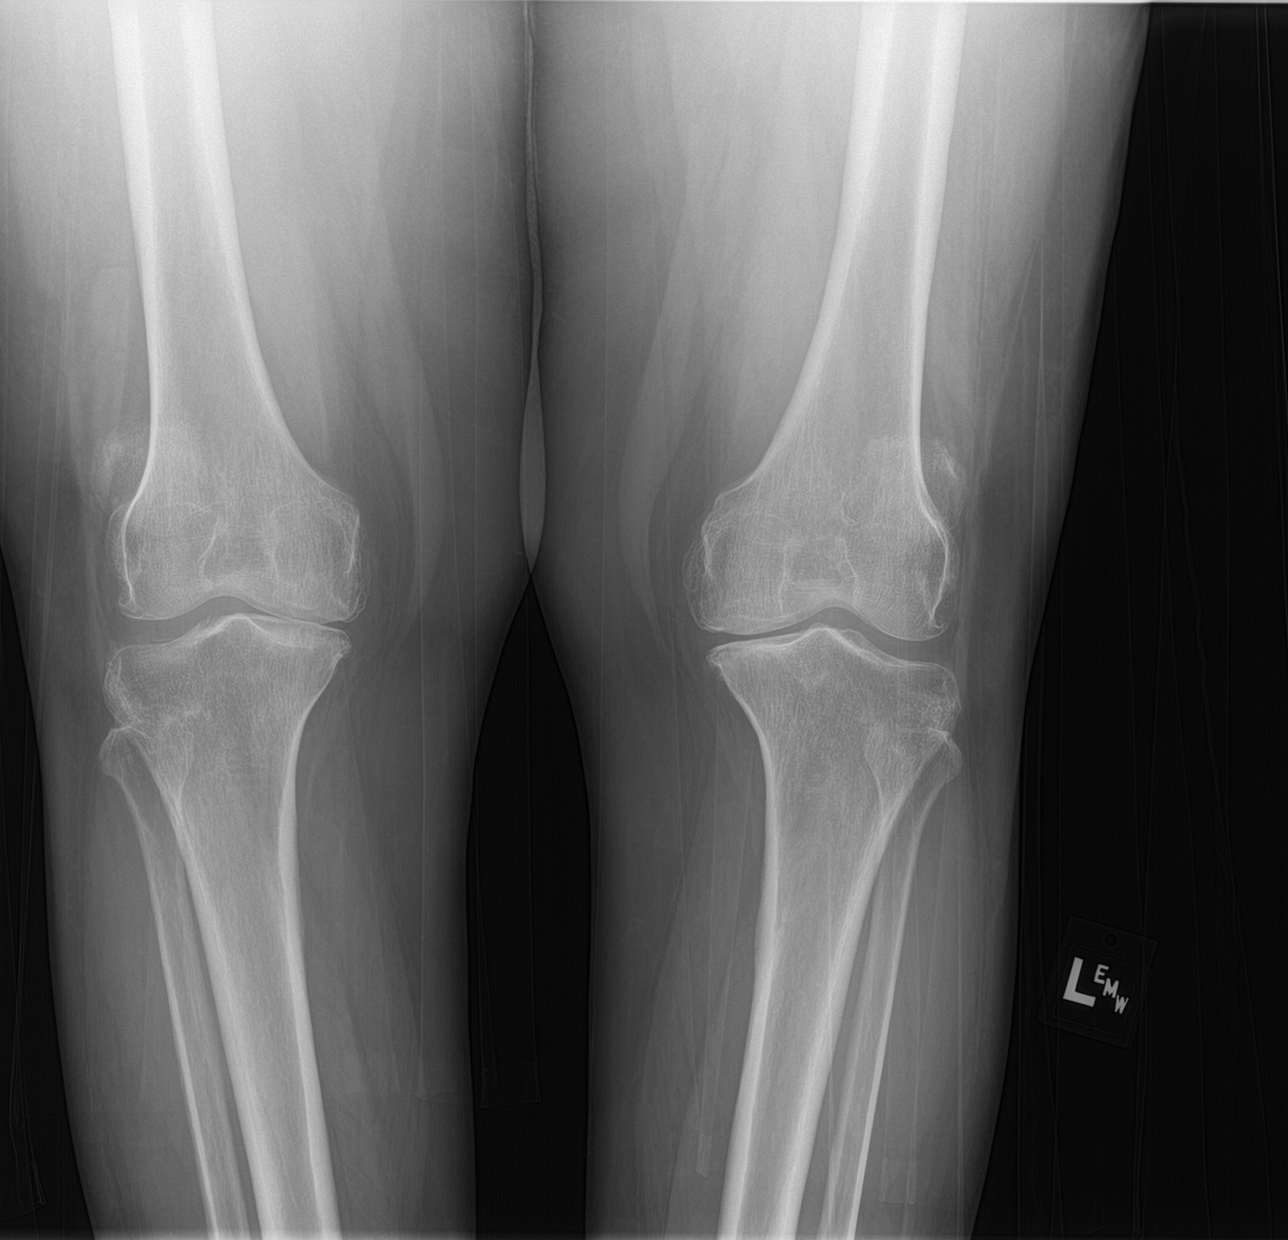

[tunnel]
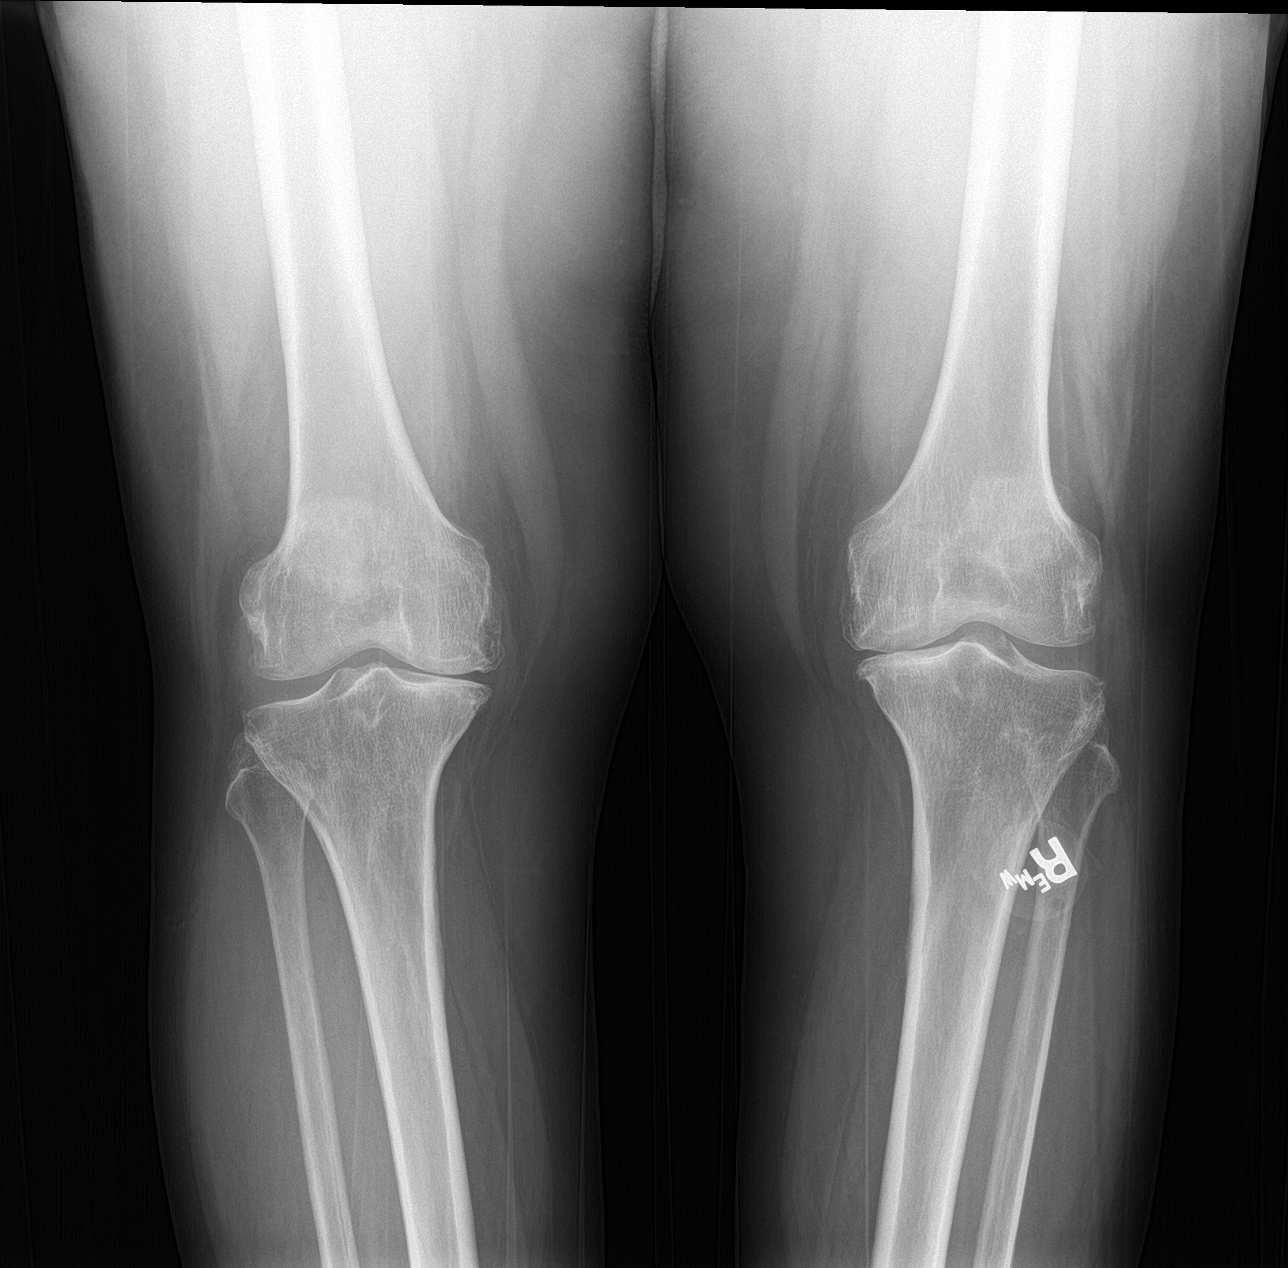

[knee lat]
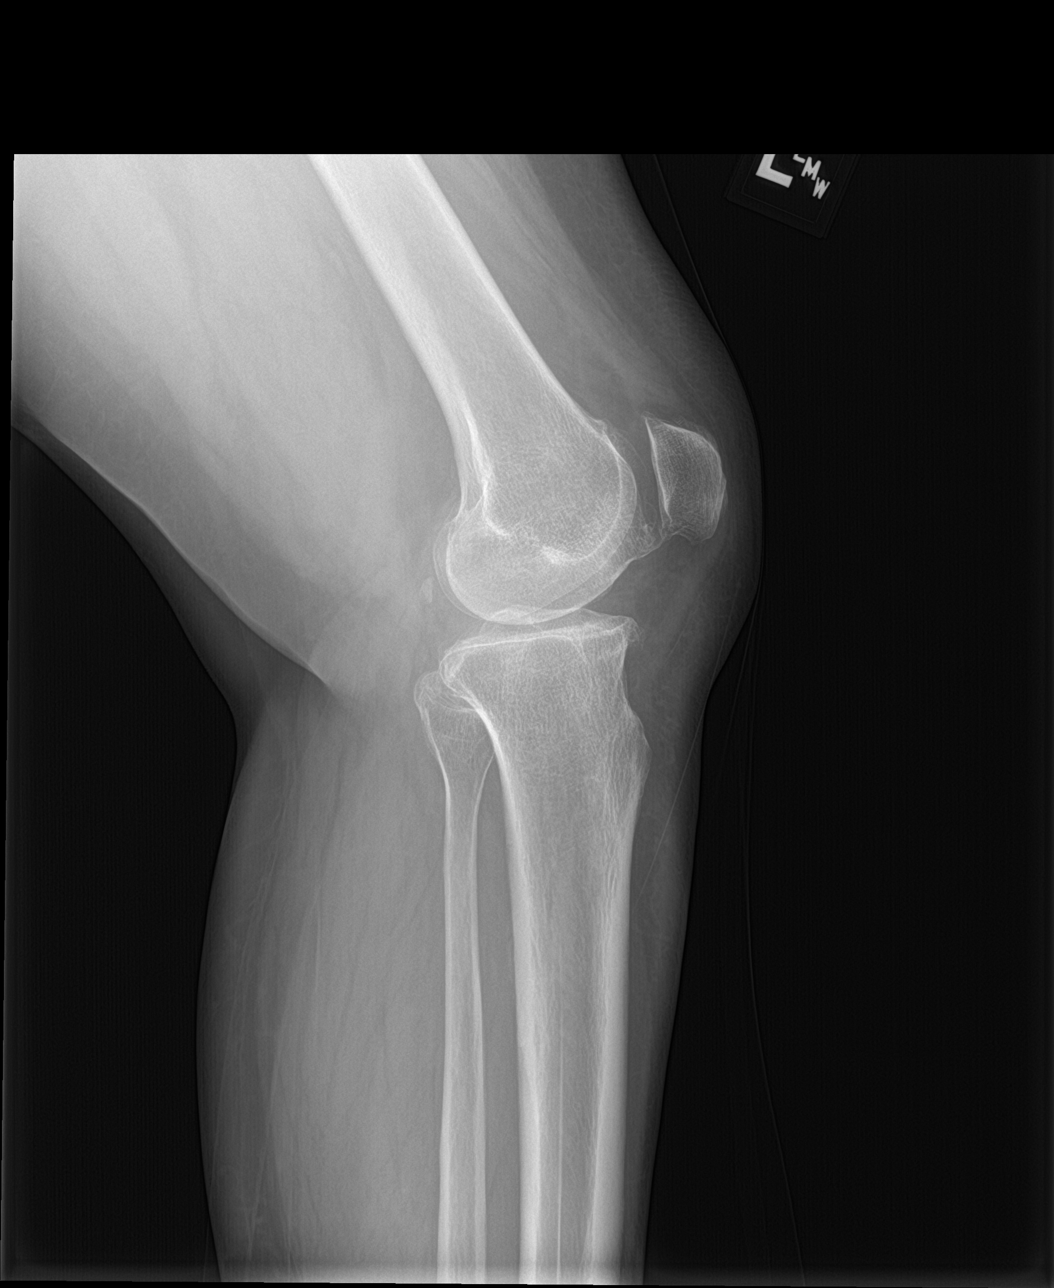

[knee sunrise]
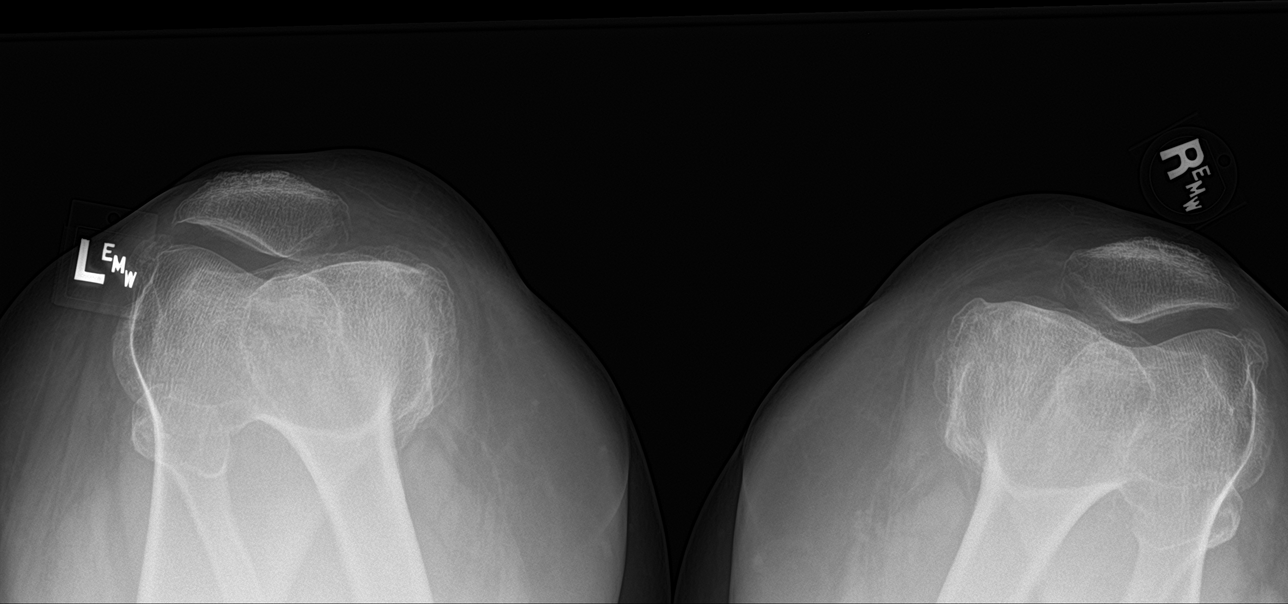

[4 of 4 positions shown; findings below may reference images not displayed]

FINDINGS: No evidence of fracture, dislocation, or joint effusion. Moderate
narrowing of medial joint space is noted. Soft tissues are
unremarkable.
IMPRESSION: Moderate degenerative joint disease is noted medially. No acute
abnormality seen in the right knee.

## 2019-10-28 IMAGING — DX DG LUMBAR SPINE COMPLETE 4+V
5 series · 5 of 5 positions shown · non-contrast
Comparison: CT scan of August 02, 2017.

CLINICAL DATA: Chronic lower back and bilateral lower extremity
pain without known injury.

EXAM:
LUMBAR SPINE - COMPLETE 4+ VIEW

[l-spine ap]
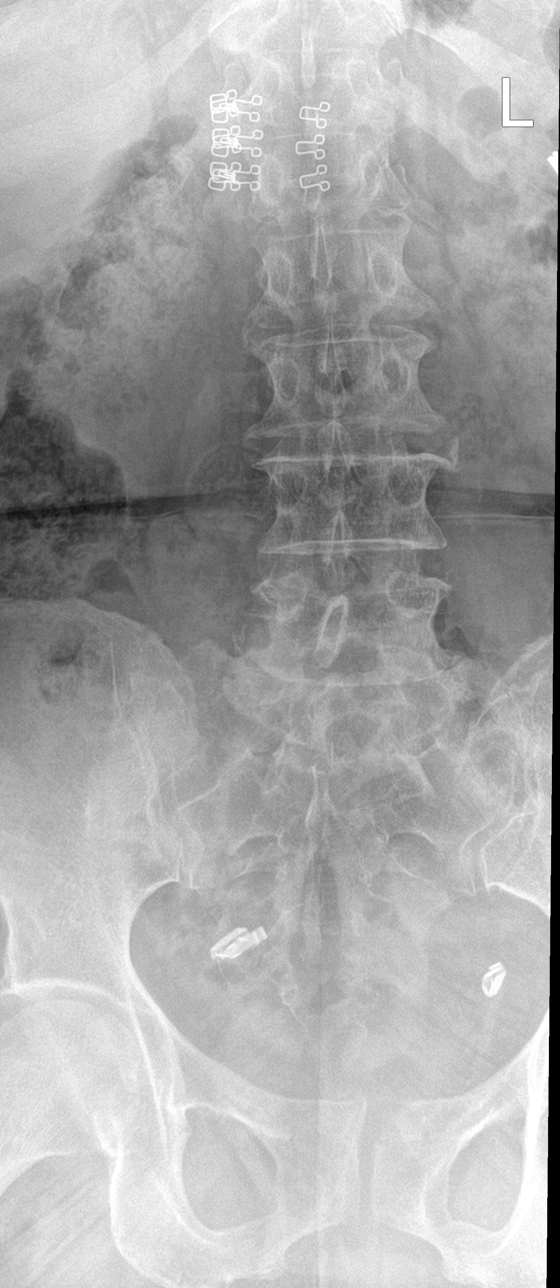

[l-spine obl (1 of 2)]
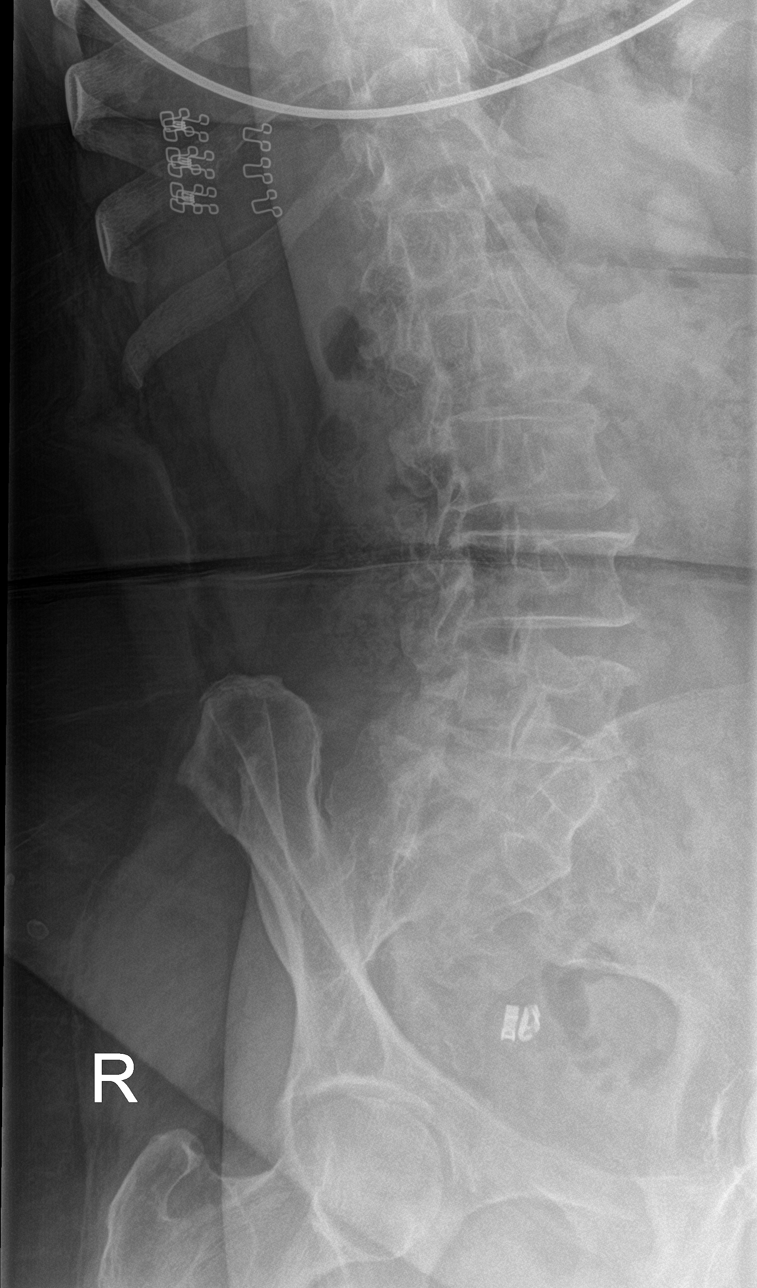

[l-spine obl (2 of 2)]
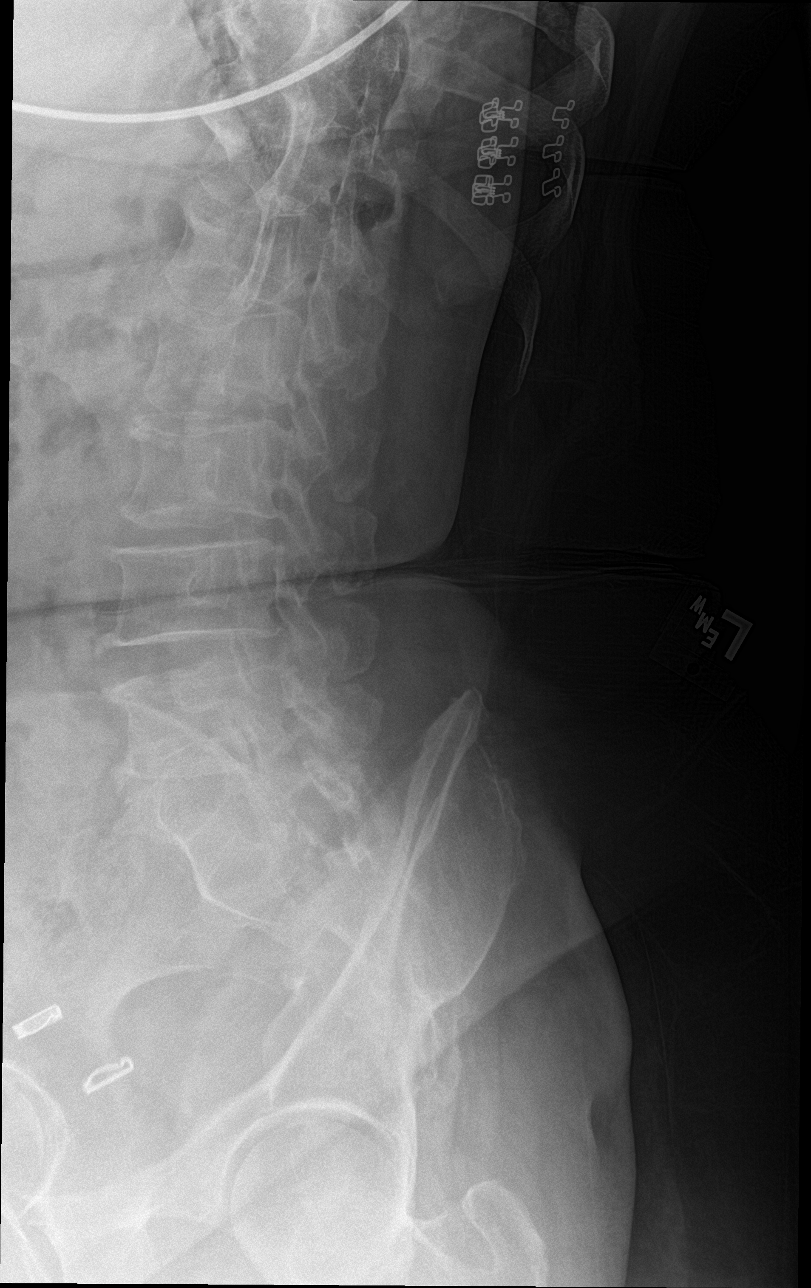

[l-spine lat]
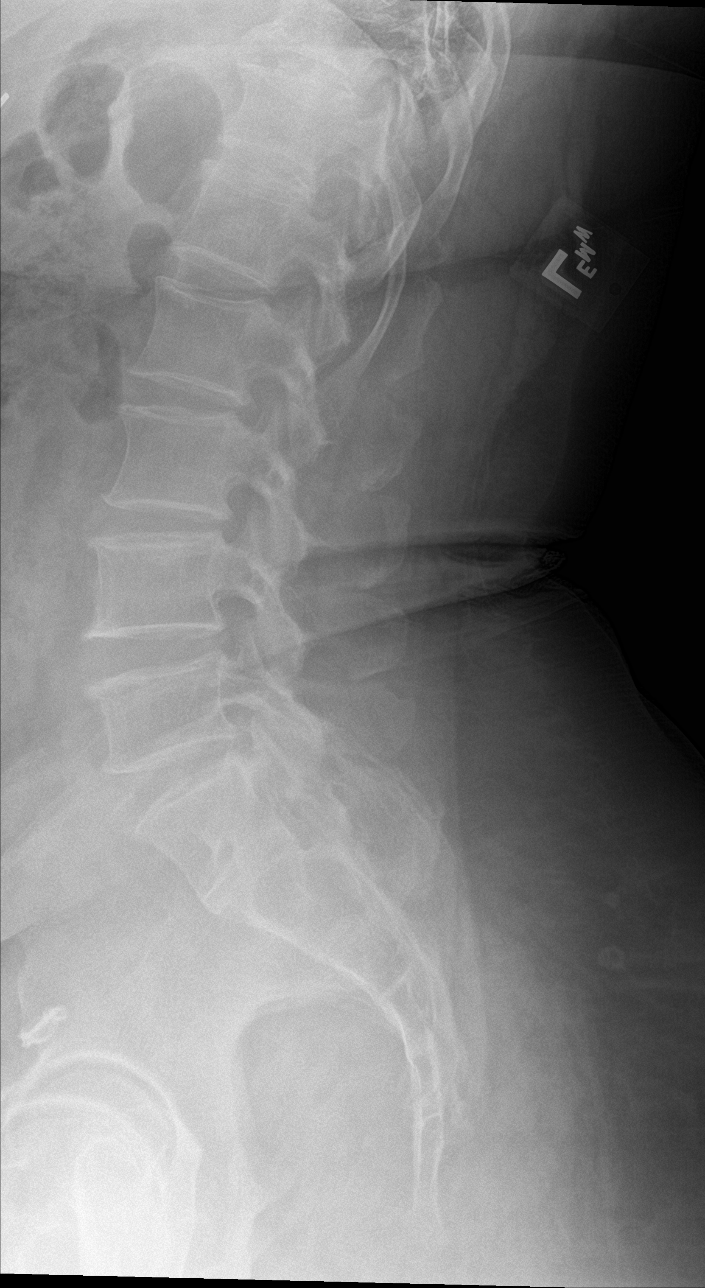

[l-spine spot]
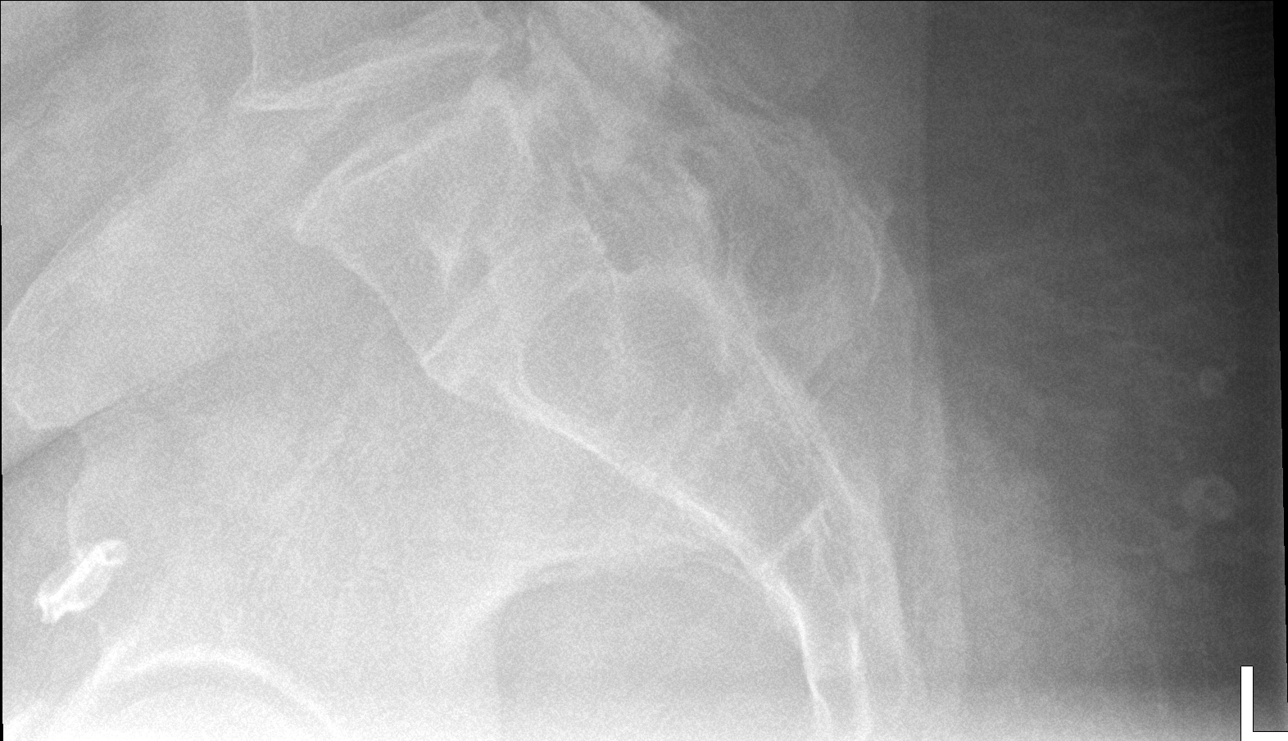

[5 of 5 positions shown; findings below may reference images not displayed]

FINDINGS: No fracture or spondylolisthesis is noted. Disc spaces are
well-maintained. Mild anterior osteophyte formation is noted at
L2-3, L3-4 and L4-5.
IMPRESSION: Mild degenerative changes as described above. No acute abnormality
seen in the lumbar spine.

## 2021-03-30 ENCOUNTER — Inpatient Hospital Stay: Payer: Medicaid Other | Admitting: Physician Assistant

## 2021-03-30 NOTE — Progress Notes (Deleted)
Patient ID: Jenny Nelson, female   DOB: 1965-12-24, 55 y.o.   MRN: [id number]   After ED visit 02/12/2021:   From discharge summary: ED Course / Medical Decision Making   If applicable, chart review of recent ED visits and/or hospital admissions was performed. Of note, patient has had several ED visits in the past for similar symptoms, with no definitive findings.  Jenny Nelson is a 55 y.o. female who was seen in the ED for recurrence of right flank/lumbar back pain and right upper abdominal and chest wall pain. Given history and exam, there was initial concern for possible malignancy versus renal or aortic pathology. Pertinent studies were obtained, with results listed in chart above. Lab work is unremarkable. CT chest abdomen pelvis imaging study does not reveal any acute pathology to explain patient's symptoms. CT lumbar spine imaging did show evidence of degenerative disc disease and foraminal stenosis, that could correlate with some of patient's recurrent back pain symptoms. There was no clinical evidence to suggest SCI or cauda equina syndrome. She had adequate symptom control after receiving IVF bolus, morphine and zofran. At this time, exact etiology of patient's symptoms is unclear, however I have low suspicion for emergent cause. She is appropriate for discharge home with PCP follow-up. Strict return precautions regarding this ED visit were discussed.  ED Clinical Impression   1. Acute midline low back pain, unspecified whether sciatica present  2. DDD (degenerative disc disease), lumbar  3. Strain of lumbar region, initial encounter  4. Type 2 diabetes mellitus with hypoglycemia without coma, without long-term current use of insulin (HCC)

## 2022-10-31 ENCOUNTER — Emergency Department (HOSPITAL_COMMUNITY): Payer: Medicaid Other

## 2022-10-31 ENCOUNTER — Encounter (HOSPITAL_COMMUNITY): Payer: Self-pay | Admitting: Emergency Medicine

## 2022-10-31 ENCOUNTER — Emergency Department (HOSPITAL_COMMUNITY)
Admission: EM | Admit: 2022-10-31 | Discharge: 2022-10-31 | Disposition: A | Discharge status: Home / Self Care | Payer: Medicaid Other | Attending: Emergency Medicine | Admitting: Emergency Medicine

## 2022-10-31 ENCOUNTER — Other Ambulatory Visit: Payer: Self-pay

## 2022-10-31 DIAGNOSIS — S39011A Strain of muscle, fascia and tendon of abdomen, initial encounter: Secondary | ICD-10-CM

## 2022-10-31 DIAGNOSIS — E1140 Type 2 diabetes mellitus with diabetic neuropathy, unspecified: Secondary | ICD-10-CM | POA: Diagnosis not present

## 2022-10-31 DIAGNOSIS — S3991XA Unspecified injury of abdomen, initial encounter: Secondary | ICD-10-CM | POA: Diagnosis present

## 2022-10-31 DIAGNOSIS — M4800 Spinal stenosis, site unspecified: Secondary | ICD-10-CM | POA: Diagnosis not present

## 2022-10-31 DIAGNOSIS — R0689 Other abnormalities of breathing: Secondary | ICD-10-CM | POA: Diagnosis not present

## 2022-10-31 DIAGNOSIS — Z79899 Other long term (current) drug therapy: Secondary | ICD-10-CM | POA: Insufficient documentation

## 2022-10-31 DIAGNOSIS — J45909 Unspecified asthma, uncomplicated: Secondary | ICD-10-CM | POA: Diagnosis not present

## 2022-10-31 DIAGNOSIS — X58XXXA Exposure to other specified factors, initial encounter: Secondary | ICD-10-CM | POA: Diagnosis not present

## 2022-10-31 DIAGNOSIS — Z7984 Long term (current) use of oral hypoglycemic drugs: Secondary | ICD-10-CM | POA: Diagnosis not present

## 2022-10-31 DIAGNOSIS — Z7951 Long term (current) use of inhaled steroids: Secondary | ICD-10-CM | POA: Diagnosis not present

## 2022-10-31 DIAGNOSIS — I10 Essential (primary) hypertension: Secondary | ICD-10-CM | POA: Insufficient documentation

## 2022-10-31 DIAGNOSIS — R109 Unspecified abdominal pain: Secondary | ICD-10-CM

## 2022-10-31 LAB — CBC WITH DIFFERENTIAL/PLATELET
Abs Immature Granulocytes: 0.01 10*3/uL (ref 0.00–0.07)
Basophils Absolute: 0 10*3/uL (ref 0.0–0.1)
Basophils Relative: 1 %
Eosinophils Absolute: 0.1 10*3/uL (ref 0.0–0.5)
Eosinophils Relative: 2 %
HCT: 38.3 % (ref 36.0–46.0)
Hemoglobin: 12.1 g/dL (ref 12.0–15.0)
Immature Granulocytes: 0 %
Lymphocytes Relative: 42 %
Lymphs Abs: 2.3 10*3/uL (ref 0.7–4.0)
MCH: 28.2 pg (ref 26.0–34.0)
MCHC: 31.6 g/dL (ref 30.0–36.0)
MCV: 89.3 fL (ref 80.0–100.0)
Monocytes Absolute: 0.4 10*3/uL (ref 0.1–1.0)
Monocytes Relative: 6 %
Neutro Abs: 2.7 10*3/uL (ref 1.7–7.7)
Neutrophils Relative %: 49 %
Platelets: 269 10*3/uL (ref 150–400)
RBC: 4.29 MIL/uL (ref 3.87–5.11)
RDW: 13.5 % (ref 11.5–15.5)
WBC: 5.5 10*3/uL (ref 4.0–10.5)
nRBC: 0 % (ref 0.0–0.2)

## 2022-10-31 LAB — URINALYSIS, ROUTINE W REFLEX MICROSCOPIC
Bilirubin Urine: NEGATIVE
Glucose, UA: NEGATIVE mg/dL
Hgb urine dipstick: NEGATIVE
Ketones, ur: NEGATIVE mg/dL
Leukocytes,Ua: NEGATIVE
Nitrite: NEGATIVE
Protein, ur: NEGATIVE mg/dL
Specific Gravity, Urine: 1.009 (ref 1.005–1.030)
pH: 7 (ref 5.0–8.0)

## 2022-10-31 LAB — COMPREHENSIVE METABOLIC PANEL
ALT: 11 U/L (ref 0–44)
AST: 19 U/L (ref 15–41)
Albumin: 3.5 g/dL (ref 3.5–5.0)
Alkaline Phosphatase: 67 U/L (ref 38–126)
Anion gap: 12 (ref 5–15)
BUN: 8 mg/dL (ref 6–20)
CO2: 26 mmol/L (ref 22–32)
Calcium: 9.4 mg/dL (ref 8.9–10.3)
Chloride: 104 mmol/L (ref 98–111)
Creatinine, Ser: 0.77 mg/dL (ref 0.44–1.00)
GFR, Estimated: >60 mL/min (ref >60)
Glucose, Bld: 129 mg/dL — ABNORMAL HIGH (ref 70–99)
Potassium: 3.9 mmol/L (ref 3.5–5.1)
Sodium: 142 mmol/L (ref 135–145)
Total Bilirubin: 0.6 mg/dL (ref 0.3–1.2)
Total Protein: 6.9 g/dL (ref 6.5–8.1)

## 2022-10-31 LAB — LIPASE, BLOOD: Lipase: 30 U/L (ref 11–51)

## 2022-10-31 LAB — TROPONIN I (HIGH SENSITIVITY): Troponin I (High Sensitivity): 6 ng/L (ref <18)

## 2022-10-31 MED ORDER — ONDANSETRON HCL 4 MG/2ML IJ SOLN
4.0000 mg | Freq: Once | INTRAMUSCULAR | Status: AC
  Administered 2022-10-31: 4 mg via INTRAVENOUS
  Filled 2022-10-31: qty 2

## 2022-10-31 MED ORDER — MORPHINE SULFATE (PF) 4 MG/ML IV SOLN
4.0000 mg | Freq: Once | INTRAVENOUS | Status: AC
  Administered 2022-10-31: 4 mg via INTRAVENOUS
  Filled 2022-10-31: qty 1

## 2022-10-31 MED ORDER — HYDROMORPHONE HCL 1 MG/ML IJ SOLN
1.0000 mg | Freq: Once | INTRAMUSCULAR | Status: AC
  Administered 2022-10-31: 1 mg via INTRAVENOUS
  Filled 2022-10-31: qty 1

## 2022-10-31 MED ORDER — ONDANSETRON 4 MG PO TBDP
4.0000 mg | ORAL_TABLET | Freq: Once | ORAL | Status: AC
  Administered 2022-10-31: 4 mg via ORAL
  Filled 2022-10-31: qty 1

## 2022-10-31 MED ORDER — LACTATED RINGERS IV BOLUS
1000.0000 mL | Freq: Once | INTRAVENOUS | Status: AC
  Administered 2022-10-31: 1000 mL via INTRAVENOUS

## 2022-10-31 MED ORDER — ONDANSETRON 4 MG PO TBDP: 4.0000 mg | ORAL_TABLET | Freq: Three times a day (TID) | ORAL | 0 refills | Status: AC | PRN

## 2022-10-31 MED ORDER — IOHEXOL 350 MG/ML SOLN
75.0000 mL | Freq: Once | INTRAVENOUS | Status: AC | PRN
  Administered 2022-10-31: 75 mL via INTRAVENOUS

## 2022-10-31 MED ORDER — HYDROMORPHONE HCL 1 MG/ML IJ SOLN
0.5000 mg | Freq: Once | INTRAMUSCULAR | Status: AC
  Administered 2022-10-31: 0.5 mg via INTRAVENOUS
  Filled 2022-10-31: qty 1

## 2022-10-31 MED ORDER — HYDROCODONE-ACETAMINOPHEN 5-325 MG PO TABS: 1.0000 | ORAL_TABLET | Freq: Four times a day (QID) | ORAL | 0 refills | Status: AC | PRN

## 2022-10-31 MED ORDER — PREDNISONE 10 MG (21) PO TBPK: ORAL_TABLET | Freq: Every day | ORAL | 0 refills | Status: AC

## 2022-10-31 MED ORDER — OXYCODONE-ACETAMINOPHEN 5-325 MG PO TABS
1.0000 | ORAL_TABLET | Freq: Once | ORAL | Status: AC
  Administered 2022-10-31: 1 via ORAL
  Filled 2022-10-31: qty 1

## 2022-10-31 MED ORDER — KETOROLAC TROMETHAMINE 15 MG/ML IJ SOLN
15.0000 mg | Freq: Once | INTRAMUSCULAR | Status: AC
  Administered 2022-10-31: 15 mg via INTRAVENOUS
  Filled 2022-10-31: qty 1

## 2022-10-31 NOTE — Discharge Instructions (Addendum)
Your workup today was overall reassuring.  No concerning cause of your abdominal pain identified.  Blood work, CT scan of your chest as well as abdomen did not show any concerning findings.  Your CT scan of the abdomen however did show that you have some degenerative disease of the spine with some stenosis.  This could be impinging on the nerve and causing referred pain.  Alternatively more likely you have a muscle strain of the abdominal wall.  I have sent prednisone Dosepak, as well as short course of pain medication to your pharmacy.  For any concerning symptoms please return to the emergency department otherwise please follow-up with your primary care provider for reevaluation.  Keep a close eye on your blood sugars while you are taking the prednisone as this can cause your blood sugars to elevate.

## 2022-10-31 NOTE — ED Notes (Signed)
All discharge instructions including follow up care and prescriptions reviewed with patient and patient's son at bedside. Both verbalized understanding of same and had no other questions. Patient stable at time of discharge and wheel chair was provided to patient prior to leaving the department.

## 2022-10-31 NOTE — ED Notes (Signed)
Patient provided with some crackers and ginger ale to drink at this time.

## 2022-10-31 NOTE — ED Provider Triage Note (Addendum)
Emergency Medicine Provider Triage Evaluation Note  Jenny Nelson , a 57 y.o. female  was evaluated in triage.  Pt complains of severe right flank pain, hematuria intermittently since December but significantly worse today. Referred to ED by her doctor. Denies fever, chills, nausea, vomiting, dysuria, decreased urine, frequency. Denies history of frequent UTIs. Denies chest pain. When pain is severe, feels short of breath and it will wake her from her sleep. Been diagnosed with unspecified "kidney problem." Son translating in triage.   Review of Systems  Positive: See HPI Negative: See HPI  Physical Exam  BP (!) 160/85 (BP Location: Right Arm)   Pulse 63   Temp 98 F (36.7 C) (Oral)   Resp 20   SpO2 97%  Gen:   Awake, mild distress secondary to pain, appears very uncomfortable Resp:  Normal effort  MSK:   Moves extremities without difficulty  Other:  RUQ and RLQ tenderness, R CVA tenderness, abdomen soft  Medical Decision Making  Medically screening exam initiated at 1:07 PM.  Appropriate orders placed.  Jenny Nelson was informed that the remainder of the evaluation will be completed by another provider, this initial triage assessment does not replace that evaluation, and the importance of remaining in the ED until their evaluation is complete.     Jenny Nelson 10/31/22 1244   Last viewable CTAP by Care Everywhere on 12/11 without acute findings   EXAM: CT ABDOMEN AND PELVIS WITH CONTRAST  TECHNIQUE: Multidetector CT imaging of the abdomen and pelvis was performed using the standard protocol following bolus administration of intravenous contrast.  RADIATION DOSE REDUCTION: This exam was performed according to the departmental dose-optimization program which includes automated exposure control, adjustment of the mA and/or kV according to patient size and/or use of iterative reconstruction technique.  CONTRAST:  100 cc Omnipaque 350  COMPARISON:  CT  abdomen/pelvis 08/05/2022  FINDINGS: Lower chest: The focal consolidative opacity measuring 2.1 cm x 1.4 cm in the medial right base is unchanged going back multiple prior studies. The lung bases are otherwise clear. The imaged heart is unremarkable.  Hepatobiliary: The liver and gallbladder are unremarkable. There is no biliary ductal dilatation.  Pancreas: Unremarkable.  Spleen: Unremarkable.  Adrenals/Urinary Tract: The adrenals are unremarkable.  The kidneys are unremarkable, with no focal lesion, stone, hydronephrosis, or hydroureter. There is symmetric excretion of contrast into the collecting systems on the delayed images. The bladder is unremarkable.  Stomach/Bowel: There is a small hiatal hernia. The stomach is otherwise unremarkable. There is no evidence of bowel obstruction. There is no abnormal bowel wall thickening or inflammatory change.  Vascular/Lymphatic: The abdominal aorta is normal in course and caliber. The major branch vessels are patent. The main portal and splenic veins are patent. There is no abdominal or pelvic lymphadenopathy.  Reproductive: The uterus is unremarkable. Bilateral tubal ligation clips are noted. There is no adnexal mass.  Other: There is no ascites or free air.  Musculoskeletal: There is no acute osseous abnormality or suspicious osseous lesion. The prominent Schmorl's node indenting the inferior L3 endplate is unchanged.  IMPRESSION: No acute finding in the abdomen or pelvis.   Electronically Signed   By: Valetta Mole M.D.   On: 09/25/2022 16:51 Procedure Note  Jobe Gibbon, MD - 09/25/2022 Formatting of this note might be different from the original. CLINICAL DATA:  Dysuria and right upper quadrant pain.  EXAM: CT ABDOMEN AND PELVIS WITH CONTRAST  TECHNIQUE: Multidetector CT imaging of the abdomen  and pelvis was performed using the standard protocol following bolus administration of intravenous  contrast.  RADIATION DOSE REDUCTION: This exam was performed according to the departmental dose-optimization program which includes automated exposure control, adjustment of the mA and/or kV according to patient size and/or use of iterative reconstruction technique.  CONTRAST:  100 cc Omnipaque 350  COMPARISON:  CT abdomen/pelvis 08/05/2022  FINDINGS: Lower chest: The focal consolidative opacity measuring 2.1 cm x 1.4 cm in the medial right base is unchanged going back multiple prior studies. The lung bases are otherwise clear. The imaged heart is unremarkable.  Hepatobiliary: The liver and gallbladder are unremarkable. There is no biliary ductal dilatation.  Pancreas: Unremarkable.  Spleen: Unremarkable.  Adrenals/Urinary Tract: The adrenals are unremarkable.  The kidneys are unremarkable, with no focal lesion, stone, hydronephrosis, or hydroureter. There is symmetric excretion of contrast into the collecting systems on the delayed images. The bladder is unremarkable.  Stomach/Bowel: There is a small hiatal hernia. The stomach is otherwise unremarkable. There is no evidence of bowel obstruction. There is no abnormal bowel wall thickening or inflammatory change.  Vascular/Lymphatic: The abdominal aorta is normal in course and caliber. The major branch vessels are patent. The main portal and splenic veins are patent. There is no abdominal or pelvic lymphadenopathy.  Reproductive: The uterus is unremarkable. Bilateral tubal ligation clips are noted. There is no adnexal mass.  Other: There is no ascites or free air.  Musculoskeletal: There is no acute osseous abnormality or suspicious osseous lesion. The prominent Schmorl's node indenting the inferior L3 endplate is unchanged.  IMPRESSION: No acute finding in the abdomen or pelvis.   Suzzette Righter, PA-C 10/31/22 1308

## 2022-10-31 NOTE — ED Provider Notes (Signed)
[facility] EMERGENCY DEPARTMENT Provider Note   CSN: [id number] Arrival date & time: 10/31/22  1229     History  Chief Complaint  Patient presents with   Abdominal Pain   Flank Pain    Jenny Nelson is a 57 y.o. female.  57 year old female presents today for evaluation of right right-sided abdominal pain, right flank pain that has now been ongoing for couple months.  She denies fever, chills, dysuria, nausea, vomiting, vaginal discharge.  She is accompanied by her son who is at bedside.  Patient was previously seen at [facility] where at that time she had some indication of a UTI/pyelonephritis.  She was admitted overnight for observation.  She states she had some improvement in symptoms and she was discharged.  She states currently her pain feels similar, however it is much more intense.  She denies any injury.  She also states when she sleeps she has an apneic episode followed by significant coughing spells.  She feels that she has sleep apnea however has not been able to get in for a formal sleep study.  She denies any chest pain, shortness of breath.  She does state that the pain in her flank and on her right side is worse when she takes a deep breath.  The history is provided by the patient. No language interpreter was used.       Home Medications Prior to Admission medications   Medication Sig Start Date End Date Taking? Authorizing Provider  albuterol (PROVENTIL HFA;VENTOLIN HFA) 108 (90 Base) MCG/ACT inhaler Inhale into the lungs every 6 (six) hours as needed for wheezing or shortness of breath.    [provider]  atorvastatin (LIPITOR) 20 MG tablet Take 20 mg by mouth daily.    [provider]  cyclobenzaprine (FLEXERIL) 10 MG tablet Take 1 tablet (10 mg total) by mouth 3 (three) times daily as needed for muscle spasms. 12/19/17   Rodolph Bong, MD  diclofenac sodium (VOLTAREN) 1 % GEL Apply 4 g topically 4 (four)  times daily. To affected joint. 08/06/18   Rodolph Bong, MD  Fluticasone-Salmeterol (ADVAIR) 250-50 MCG/DOSE AEPB Inhale 1 puff into the lungs 2 (two) times daily.    [provider]  gabapentin (NEURONTIN) 300 MG capsule Take 300 mg by mouth 3 (three) times daily.    [provider]  lisinopril (PRINIVIL,ZESTRIL) 20 MG tablet Take 20 mg by mouth daily.    [provider]  meloxicam (MOBIC) 15 MG tablet Take 15 mg by mouth daily.    [provider]  metFORMIN (GLUCOPHAGE) 500 MG tablet Take by mouth 2 (two) times daily with a meal.    [provider]  metoprolol tartrate (LOPRESSOR) 25 MG tablet Take 25 mg by mouth 2 (two) times daily.    [provider]  omeprazole (PRILOSEC) 40 MG capsule TK ONE C PO ONCE D B A MEAL 12/10/17   [provider]  traMADol (ULTRAM) 50 MG tablet TK 1 T PO Q 6 TO 8 H PRF 30 DAYS 11/27/17   [provider]      Allergies    Asa [aspirin], Bactrim [sulfamethoxazole-trimethoprim], Pork-derived products, Fish allergy, and Sulfamethoxazole-trimethoprim    Review of Systems   Review of Systems  Constitutional:  Negative for chills and fever.  Respiratory:  Negative for shortness of breath.   Cardiovascular:  Negative for chest pain and leg swelling.  Gastrointestinal:  Positive for abdominal pain. Negative for  abdominal distention, blood in stool, constipation, nausea and vomiting.  Genitourinary:  Negative for difficulty urinating, dysuria, flank pain, vaginal bleeding and vaginal discharge.  Neurological:  Negative for syncope and light-headedness.  All other systems reviewed and are negative.   Physical Exam Updated Vital Signs BP (!) 168/97   Pulse 64   Temp (!) 97.5 F (36.4 C)   Resp 17   SpO2 100%  Physical Exam Vitals and nursing note reviewed.  Constitutional:      General: She is not in acute distress.    Appearance: Normal appearance. She is not ill-appearing.     Comments:  Appears uncomfortable  HENT:     Head: Normocephalic and atraumatic.     Nose: Nose normal.  Eyes:     General: No scleral icterus.    Extraocular Movements: Extraocular movements intact.     Conjunctiva/sclera: Conjunctivae normal.  Cardiovascular:     Rate and Rhythm: Normal rate and regular rhythm.     Pulses: Normal pulses.  Pulmonary:     Effort: Pulmonary effort is normal. No respiratory distress.     Breath sounds: Normal breath sounds. No wheezing or rales.  Abdominal:     General: There is no distension.     Palpations: Abdomen is soft.     Tenderness: There is abdominal tenderness (right sided abdominal wall tenderness). There is right CVA tenderness. There is no left CVA tenderness, guarding or rebound.  Musculoskeletal:        General: Normal range of motion.     Cervical back: Normal range of motion.  Skin:    General: Skin is warm and dry.  Neurological:     General: No focal deficit present.     Mental Status: She is alert and oriented to person, place, and time. Mental status is at baseline.     ED Results / Procedures / Treatments   Labs (all labs ordered are listed, but only abnormal results are displayed) Labs Reviewed  COMPREHENSIVE METABOLIC PANEL - Abnormal; Notable for the following components:      Result Value   Glucose, Bld 129 (*)    All other components within normal limits  CBC WITH DIFFERENTIAL/PLATELET  LIPASE, BLOOD  URINALYSIS, ROUTINE W REFLEX MICROSCOPIC    EKG EKG Interpretation  Date/Time:  Tuesday October 31 2022 12:58:05 EST Ventricular Rate:  59 PR Interval:  98 QRS Duration: 96 QT Interval:  444 QTC Calculation: 439 R Axis:   79 Text Interpretation: Sinus bradycardia with short PR Septal infarct , age undetermined Lateral injury pattern Baseline wander, ST depressions inferior lead more exaggerated than prior, rate slower Confirmed by Alvira Monday ([id number]) on 10/31/2022 3:27:30 PM  Radiology CT ABDOMEN PELVIS WO  CONTRAST  Result Date: 10/31/2022 CLINICAL DATA:  Abdominal and flank pain. EXAM: CT ABDOMEN AND PELVIS WITHOUT CONTRAST TECHNIQUE: Multidetector CT imaging of the abdomen and pelvis was performed following the standard protocol without IV contrast. RADIATION DOSE REDUCTION: This exam was performed according to the departmental dose-optimization program which includes automated exposure control, adjustment of the mA and/or kV according to patient size and/or use of iterative reconstruction technique. COMPARISON:  CT 09/25/2022 and ultrasound.  Older exams as well. FINDINGS: Lower chest: Once again there is a nodular area of opacity in the medial right lower lobe just above the diaphragm. Previously this measured 2.1 x 1.4 cm and today 2.3 by 1.4 cm, relatively similar when adjusting for technique. This has been present since at least April 2022. Please  correlate with any interval workup or additional evaluation is recommended. No pleural effusion. Small hiatal hernia. Hepatobiliary: On this non IV contrast exam, the liver is grossly preserved. Gallbladder is nondilated. Pancreas: Preserved pancreatic parenchyma without obvious mass by noncontrast. Spleen: The spleen is nonenlarged. Adrenals/Urinary Tract: Adrenal glands are preserved. No abnormal calcifications are seen within either kidney nor along the course of either ureter. Bladder is underdistended but grossly preserved in contour. Stomach/Bowel: On this non oral contrast exam, the large bowel has a normal course and caliber with scattered stool. Normal appendix extends medial to the cecum in the right lower quadrant. Stomach is nondilated. Small bowel is nondilated. No free air or free fluid. Vascular/Lymphatic: Normal caliber aorta and IVC with mild atherosclerotic calcifications. No specific abnormal lymph node enlargement identified in the abdomen or pelvis on this noncontrast exam. Reproductive: Uterus is present.  Bilateral tubal occlusion clips.  Other: Small fat containing umbilical hernia. Musculoskeletal: Scattered degenerative changes along the spine and pelvis. There is a transitional lumbosacral segment. There is some disc bulging identified as well posteriorly and inferiorly with some areas of canal narrowing in the lumbar spine. Please correlate with any symptoms. IMPRESSION: No obstructing renal stone. No bowel obstruction, free air or free fluid. Scattered stool normal appendix. Degenerative changes along the spine with stenosis. Stable nodular focus along the medial right lung base stable since at least April 2022. Please correlate with any prior history of workup. Small hiatal hernia. Electronically Signed   By: Jill Side M.D.   On: 10/31/2022 13:32    Procedures Procedures    Medications Ordered in ED Medications  ondansetron (ZOFRAN-ODT) disintegrating tablet 4 mg (4 mg Oral Given 10/31/22 1258)  oxyCODONE-acetaminophen (PERCOCET/ROXICET) 5-325 MG per tablet 1 tablet (1 tablet Oral Given 10/31/22 1300)  morphine (PF) 4 MG/ML injection 4 mg (4 mg Intravenous Given 10/31/22 1642)  ondansetron (ZOFRAN) injection 4 mg (4 mg Intravenous Given 10/31/22 1639)  lactated ringers bolus 1,000 mL (1,000 mLs Intravenous New Bag/Given 10/31/22 1643)    ED Course/ Medical Decision Making/ A&P                             Medical Decision Making Amount and/or Complexity of Data Reviewed Radiology: ordered.  Risk Prescription drug management.    Medical Decision Making / ED Course   This patient presents to the ED for concern of right-sided abdominal pain, flank pain, this involves an extensive number of treatment options, and is a complaint that carries with it a high risk of complications and morbidity.  The differential diagnosis includes cholecystitis, nephrolithiasis, pyelonephritis, UTI, PID, PE, atypical ACS  MDM: 57 year old female presents today for evaluation of right-sided abdominal pain with associated right flank  pain.  No dysuria, urinary frequency, difficulty urinating, vaginal discharge, vaginal bleeding.  No prior history of kidney stones.  She was recently admitted to [facility] hospital where she was diagnosed with pyelonephritis and admitted for IV antibiotics.  She had a CT scan of the abdomen done as well as a right upper quadrant ultrasound which did not show any acute findings.  She is without chest pain however her flank pain/right-sided abdominal pain is worse when she takes a deep breath.  Otherwise denies dyspnea.  She is satting at 100% on room air.  There is also questionable history of a blood clot in her right upper extremity.  She states this was a long time ago and she  was given injections on a weekly basis.  Not currently on anticoagulation.  No evidence of DVT on exam.  Will provide fluid bolus, pain control, and obtain EKG, and CTA chest to rule out PE.  CBC is unremarkable.  CMP shows glucose 129 otherwise unremarkable.  Lipase within normal limits.  UA without evidence of UTI. PE scan negative.  Troponin added which is within normal limits.  No suspicion for ACS.  Given duration of pain to obtain second troponin.  Reports improvement after pain medication.  Extensive discussion had regarding symptom management, follow-up with PCP.  She is in agreement with plan.  She is agreeable for discharge however requests 1 more dose of pain medicine prior to discharge.  Will provide Kommer dose.  Will discharge with prednisone Dosepak, Norco given she has disease of the spine with stenosis.  No suspicion for PID given duration of pain, afebrile, without leukocytosis, no vaginal discharge.  Unlikely to be Pilo given no evidence of UTI.  No prior history of kidney stone, and without hematuria.  Likely abdominal wall muscle strain with component of spinal stenosis and referred pain. Case discussed with attending who is in agreement with plan.    Lab Tests: -I ordered, reviewed, and interpreted  labs.   The pertinent results include:   Labs Reviewed  COMPREHENSIVE METABOLIC PANEL - Abnormal; Notable for the following components:      Result Value   Glucose, Bld 129 (*)    All other components within normal limits  CBC WITH DIFFERENTIAL/PLATELET  LIPASE, BLOOD  URINALYSIS, ROUTINE W REFLEX MICROSCOPIC  TROPONIN I (HIGH SENSITIVITY)  TROPONIN I (HIGH SENSITIVITY)      EKG  EKG Interpretation  Date/Time:  Tuesday October 31 2022 17:30:41 EST Ventricular Rate:  76 PR Interval:  151 QRS Duration: 98 QT Interval:  398 QTC Calculation: 448 R Axis:   72 Text Interpretation: Sinus rhythm Probable LVH with secondary repol abnrm Anterior Q waves, possibly due to LVH ST depr, consider ischemia, inferior leads No significant change since last tracing Confirmed by Alvira Monday ([id number]) on 10/31/2022 6:25:54 PM         Imaging Studies ordered: I ordered imaging studies including CTA chest PE study, CT abdomen pelvis without contrast I independently visualized and interpreted imaging. I agree with the radiologist interpretation   Medicines ordered and prescription drug management: Meds ordered this encounter  Medications   ondansetron (ZOFRAN-ODT) disintegrating tablet 4 mg   oxyCODONE-acetaminophen (PERCOCET/ROXICET) 5-325 MG per tablet 1 tablet   morphine (PF) 4 MG/ML injection 4 mg   ondansetron (ZOFRAN) injection 4 mg   lactated ringers bolus 1,000 mL   iohexol (OMNIPAQUE) 350 MG/ML injection 75 mL   HYDROmorphone (DILAUDID) injection 1 mg   ketorolac (TORADOL) 15 MG/ML injection 15 mg    -I have reviewed the patients home medicines and have made adjustments as needed  Critical interventions Pain control  Cardiac Monitoring: The patient was maintained on a cardiac monitor.  I personally viewed and interpreted the cardiac monitored which showed an underlying rhythm of: Normal sinus rhythm  Reevaluation: After the interventions noted above, I reevaluated the  patient and found that they have :improved  Co morbidities that complicate the patient evaluation  Past Medical History:  Diagnosis Date   Asthma    Cough    Diabetes (HCC)    Diabetic neuropathy (HCC)    GERD (gastroesophageal reflux disease) 12/19/2017   Hypertension    Hypertension associated with diabetes (HCC) 12/19/2017  Lung nodule       Dispostion: Patient is appropriate for discharge.  Discharged in stable condition.  Return precaution discussed.  Patient voices understanding and is in agreement with plan.   Final Clinical Impression(s) / ED Diagnoses Final diagnoses:  Right sided abdominal pain  Spinal stenosis, unspecified spinal region  Abdominal wall strain, initial encounter    Rx / DC Orders ED Discharge Orders          Ordered    HYDROcodone-acetaminophen (NORCO/VICODIN) 5-325 MG tablet  Every 6 hours PRN        10/31/22 2053    predniSONE (STERAPRED UNI-PAK 21 TAB) 10 MG (21) TBPK tablet  Daily        10/31/22 2053              Evlyn Courier, PA-C 10/31/22 2054    Gareth Morgan, MD 11/02/22 1454

## 2022-10-31 NOTE — ED Triage Notes (Signed)
Pt endorses pain to right sided abd and flank for about 2 months.

## 2023-10-08 ENCOUNTER — Emergency Department (HOSPITAL_BASED_OUTPATIENT_CLINIC_OR_DEPARTMENT_OTHER)
Admission: EM | Admit: 2023-10-08 | Discharge: 2023-10-09 | Disposition: A | Discharge status: Home / Self Care | Payer: MEDICAID | Attending: Emergency Medicine | Admitting: Emergency Medicine

## 2023-10-08 ENCOUNTER — Other Ambulatory Visit: Payer: Self-pay

## 2023-10-08 ENCOUNTER — Encounter (HOSPITAL_BASED_OUTPATIENT_CLINIC_OR_DEPARTMENT_OTHER): Payer: Self-pay | Admitting: Emergency Medicine

## 2023-10-08 DIAGNOSIS — I10 Essential (primary) hypertension: Secondary | ICD-10-CM | POA: Diagnosis not present

## 2023-10-08 DIAGNOSIS — Z7984 Long term (current) use of oral hypoglycemic drugs: Secondary | ICD-10-CM | POA: Insufficient documentation

## 2023-10-08 DIAGNOSIS — R001 Bradycardia, unspecified: Secondary | ICD-10-CM | POA: Diagnosis not present

## 2023-10-08 DIAGNOSIS — Z79899 Other long term (current) drug therapy: Secondary | ICD-10-CM | POA: Diagnosis not present

## 2023-10-08 DIAGNOSIS — R1010 Upper abdominal pain, unspecified: Secondary | ICD-10-CM

## 2023-10-08 DIAGNOSIS — Z7951 Long term (current) use of inhaled steroids: Secondary | ICD-10-CM | POA: Insufficient documentation

## 2023-10-08 DIAGNOSIS — R1013 Epigastric pain: Secondary | ICD-10-CM | POA: Diagnosis not present

## 2023-10-08 DIAGNOSIS — R110 Nausea: Secondary | ICD-10-CM | POA: Diagnosis not present

## 2023-10-08 DIAGNOSIS — E1165 Type 2 diabetes mellitus with hyperglycemia: Secondary | ICD-10-CM | POA: Diagnosis not present

## 2023-10-08 DIAGNOSIS — E1140 Type 2 diabetes mellitus with diabetic neuropathy, unspecified: Secondary | ICD-10-CM | POA: Diagnosis not present

## 2023-10-08 DIAGNOSIS — J4540 Moderate persistent asthma, uncomplicated: Secondary | ICD-10-CM | POA: Insufficient documentation

## 2023-10-08 DIAGNOSIS — R1011 Right upper quadrant pain: Secondary | ICD-10-CM | POA: Diagnosis present

## 2023-10-08 LAB — URINALYSIS, ROUTINE W REFLEX MICROSCOPIC
Bilirubin Urine: NEGATIVE
Glucose, UA: NEGATIVE mg/dL
Hgb urine dipstick: NEGATIVE
Ketones, ur: NEGATIVE mg/dL
Leukocytes,Ua: NEGATIVE
Nitrite: NEGATIVE
Protein, ur: NEGATIVE mg/dL
Specific Gravity, Urine: <=1.005 (ref 1.005–1.030)
pH: 6 (ref 5.0–8.0)

## 2023-10-08 LAB — COMPREHENSIVE METABOLIC PANEL
ALT: 17 U/L (ref 0–44)
AST: 22 U/L (ref 15–41)
Albumin: 4 g/dL (ref 3.5–5.0)
Alkaline Phosphatase: 71 U/L (ref 38–126)
Anion gap: 8 (ref 5–15)
BUN: 9 mg/dL (ref 6–20)
CO2: 23 mmol/L (ref 22–32)
Calcium: 9.2 mg/dL (ref 8.9–10.3)
Chloride: 104 mmol/L (ref 98–111)
Creatinine, Ser: 0.85 mg/dL (ref 0.44–1.00)
GFR, Estimated: >60 mL/min (ref >60)
Glucose, Bld: 171 mg/dL — ABNORMAL HIGH (ref 70–99)
Potassium: 4.2 mmol/L (ref 3.5–5.1)
Sodium: 135 mmol/L (ref 135–145)
Total Bilirubin: 0.5 mg/dL (ref <1.2)
Total Protein: 7.6 g/dL (ref 6.5–8.1)

## 2023-10-08 LAB — CBC
HCT: 38.1 % (ref 36.0–46.0)
Hemoglobin: 12.6 g/dL (ref 12.0–15.0)
MCH: 28.8 pg (ref 26.0–34.0)
MCHC: 33.1 g/dL (ref 30.0–36.0)
MCV: 87.2 fL (ref 80.0–100.0)
Platelets: 316 10*3/uL (ref 150–400)
RBC: 4.37 MIL/uL (ref 3.87–5.11)
RDW: 14.4 % (ref 11.5–15.5)
WBC: 7.4 10*3/uL (ref 4.0–10.5)
nRBC: 0 % (ref 0.0–0.2)

## 2023-10-08 LAB — LIPASE, BLOOD: Lipase: 32 U/L (ref 11–51)

## 2023-10-08 MED ORDER — LIDOCAINE VISCOUS HCL 2 % MT SOLN
15.0000 mL | Freq: Once | OROMUCOSAL | Status: AC
  Administered 2023-10-08: 15 mL via ORAL
  Filled 2023-10-08: qty 15

## 2023-10-08 MED ORDER — ALUM & MAG HYDROXIDE-SIMETH 200-200-20 MG/5ML PO SUSP
30.0000 mL | Freq: Once | ORAL | Status: AC
  Administered 2023-10-08: 30 mL via ORAL
  Filled 2023-10-08: qty 30

## 2023-10-08 MED ORDER — HYOSCYAMINE SULFATE 0.125 MG SL SUBL
0.1250 mg | SUBLINGUAL_TABLET | Freq: Once | SUBLINGUAL | Status: AC
  Administered 2023-10-08: 0.125 mg via SUBLINGUAL
  Filled 2023-10-08: qty 1

## 2023-10-08 MED ORDER — KETOROLAC TROMETHAMINE 60 MG/2ML IM SOLN
30.0000 mg | Freq: Once | INTRAMUSCULAR | Status: AC
  Administered 2023-10-08: 30 mg via INTRAMUSCULAR
  Filled 2023-10-08: qty 2

## 2023-10-08 NOTE — ED Triage Notes (Signed)
Pt presents to the ED via POV with complaints of upper abdominal pain x 2 days. Pt states that the pain is sore in nature and radiates towards her back. No meds taken PTA. A&Ox4 at this time. Denies CP or SOB.    Video interpreter utilized in triage.

## 2023-10-08 NOTE — ED Provider Notes (Signed)
[place] EMERGENCY DEPARTMENT AT [facility] Provider Note  CSN: [id number] Arrival date & time: 10/08/23 2147  Chief Complaint(s) Abdominal Pain  HPI Jenny Nelson is a 57 y.o. female {Add pertinent medical, surgical, social history, OB history to HPI:1}    Abdominal Pain   Past Medical History Past Medical History:  Diagnosis Date   Asthma    Cough    Diabetes (HCC)    Diabetic neuropathy (HCC)    GERD (gastroesophageal reflux disease) 12/19/2017   Hypertension    Hypertension associated with diabetes (HCC) 12/19/2017   Lung nodule    Patient Active Problem List   Diagnosis Date Noted   Primary osteoarthritis of left knee 06/17/2019   Primary osteoarthritis of right knee 06/17/2019   DJD (degenerative joint disease) of knee 02/12/2018   Lumbago with sciatica, left side 01/16/2018   Hypertension associated with diabetes (HCC) 12/19/2017   GERD (gastroesophageal reflux disease) 12/19/2017   Influenzal bronchitis 11/24/2016   Moderate persistent asthma, uncomplicated 10/27/2016   Pleurisy 10/27/2016   Nodule of right lung 10/27/2016   Diabetes mellitus (HCC) 10/27/2016   Neuropathy 10/27/2016   Arthritis 10/27/2016   Abdominal pain 10/27/2016   Constipation 03/15/2010   HYPERTENSION, BENIGN ESSENTIAL 11/29/2009   NASOPHARYNGITIS, ACUTE 11/29/2009   GASTROESOPHAGEAL REFLUX DISEASE 11/29/2009   KNEE PAIN 11/29/2009   Home Medication(s) Prior to Admission medications   Medication Sig Start Date End Date Taking? Authorizing Provider  albuterol (PROVENTIL HFA;VENTOLIN HFA) 108 (90 Base) MCG/ACT inhaler Inhale into the lungs every 6 (six) hours as needed for wheezing or shortness of breath.    [provider]  atorvastatin (LIPITOR) 20 MG tablet Take 20 mg by mouth daily.    [provider]  cyclobenzaprine (FLEXERIL) 10 MG tablet Take 1 tablet (10 mg total) by mouth 3 (three) times daily as needed for muscle spasms. 12/19/17   Rodolph Bong, MD  diclofenac sodium (VOLTAREN) 1 % GEL Apply 4 g topically 4 (four) times daily. To affected joint. 08/06/18   Rodolph Bong, MD  Fluticasone-Salmeterol (ADVAIR) 250-50 MCG/DOSE AEPB Inhale 1 puff into the lungs 2 (two) times daily.    [provider]  gabapentin (NEURONTIN) 300 MG capsule Take 300 mg by mouth 3 (three) times daily.    [provider]  HYDROcodone-acetaminophen (NORCO/VICODIN) 5-325 MG tablet Take 1-2 tablets by mouth every 6 (six) hours as needed. 10/31/22   Karie Mainland, Amjad, PA-C  lisinopril (PRINIVIL,ZESTRIL) 20 MG tablet Take 20 mg by mouth daily.    [provider]  meloxicam (MOBIC) 15 MG tablet Take 15 mg by mouth daily.    [provider]  metFORMIN (GLUCOPHAGE) 500 MG tablet Take by mouth 2 (two) times daily with a meal.    [provider]  metoprolol tartrate (LOPRESSOR) 25 MG tablet Take 25 mg by mouth 2 (two) times daily.    [provider]  omeprazole (PRILOSEC) 40 MG capsule TK ONE C PO ONCE D B A MEAL 12/10/17   [provider]  ondansetron (ZOFRAN-ODT) 4 MG disintegrating tablet Take 1 tablet (4 mg total) by mouth every 8 (eight) hours as needed for nausea or vomiting. 10/31/22   Karie Mainland, Amjad, PA-C  predniSONE (STERAPRED UNI-PAK 21 TAB) 10 MG (21) TBPK tablet Take by mouth daily. Take 6 tabs by mouth daily  for 2 days, then 5 tabs for 2 days, then 4 tabs for 2 days, then 3 tabs for 2 days, 2 tabs for 2  days, then 1 tab by mouth daily for 2 days 10/31/22   Marita Kansas, PA-C                                                                                                                                    Allergies Asa [aspirin], Bactrim [sulfamethoxazole-trimethoprim], Pork-derived products, Fish allergy, and Sulfamethoxazole-trimethoprim  Review of Systems Review of Systems  Gastrointestinal:  Positive for abdominal pain.   As noted in HPI  Physical Exam Vital Signs  I have reviewed the triage vital  signs BP 126/79 (BP Location: Left Arm)   Pulse (!) 59   Temp 98 F (36.7 C)   Resp 18   Ht 5\' 5"  (1.651 m)   Wt 93 kg   SpO2 100%   BMI 34.12 kg/m  *** Physical Exam  ED Results and Treatments Labs (all labs ordered are listed, but only abnormal results are displayed) Labs Reviewed  COMPREHENSIVE METABOLIC PANEL - Abnormal; Notable for the following components:      Result Value   Glucose, Bld 171 (*)    All other components within normal limits  LIPASE, BLOOD  CBC  URINALYSIS, ROUTINE W REFLEX MICROSCOPIC                                                                                                                         EKG  EKG Interpretation Date/Time:  Monday October 08 2023 22:03:12 EST Ventricular Rate:  53 PR Interval:  132 QRS Duration:  94 QT Interval:  442 QTC Calculation: 414 R Axis:   62  Text Interpretation: Sinus bradycardia Marked ST abnormality, possible inferior subendocardial injury Abnormal ECG When compared with ECG of 31-Oct-2022 17:30, PREVIOUS ECG IS PRESENT similar to prior EKG Confirmed by Rolan Bucco [phone]) on 10/08/2023 10:19:51 PM       Radiology No results found.  Medications Ordered in ED Medications - No data to display Procedures Procedures  (including critical care time) Medical Decision Making / ED Course   Medical Decision Making Amount and/or Complexity of Data Reviewed Labs: ordered.    ***    Final Clinical Impression(s) / ED Diagnoses Final diagnoses:  None    This chart was dictated using voice recognition software.  Despite best efforts to proofread,  errors can occur which can change the documentation meaning.

## 2023-10-09 LAB — D-DIMER, QUANTITATIVE: D-Dimer, Quant: 0.44 ug{FEU}/mL (ref 0.00–0.50)

## 2023-10-09 MED ORDER — PANTOPRAZOLE SODIUM 40 MG IV SOLR
40.0000 mg | Freq: Once | INTRAVENOUS | Status: AC
  Administered 2023-10-09: 40 mg via INTRAVENOUS
  Filled 2023-10-09: qty 10

## 2023-10-09 MED ORDER — METOCLOPRAMIDE HCL 5 MG/ML IJ SOLN
10.0000 mg | Freq: Once | INTRAMUSCULAR | Status: AC
  Administered 2023-10-09: 10 mg via INTRAVENOUS
  Filled 2023-10-09: qty 2

## 2023-10-09 MED ORDER — METOCLOPRAMIDE HCL 10 MG PO TABS: 10.0000 mg | ORAL_TABLET | Freq: Four times a day (QID) | ORAL | 0 refills | Status: AC | PRN

## 2023-10-24 ENCOUNTER — Other Ambulatory Visit: Payer: Self-pay

## 2023-10-24 ENCOUNTER — Emergency Department (HOSPITAL_COMMUNITY): Payer: MEDICAID

## 2023-10-24 ENCOUNTER — Encounter (HOSPITAL_COMMUNITY): Payer: Self-pay

## 2023-10-24 ENCOUNTER — Emergency Department (HOSPITAL_COMMUNITY)
Admission: EM | Admit: 2023-10-24 | Discharge: 2023-10-24 | Disposition: A | Discharge status: Home / Self Care | Payer: MEDICAID | Attending: Emergency Medicine | Admitting: Emergency Medicine

## 2023-10-24 DIAGNOSIS — Z20822 Contact with and (suspected) exposure to covid-19: Secondary | ICD-10-CM | POA: Diagnosis not present

## 2023-10-24 DIAGNOSIS — Z7984 Long term (current) use of oral hypoglycemic drugs: Secondary | ICD-10-CM | POA: Diagnosis not present

## 2023-10-24 DIAGNOSIS — J069 Acute upper respiratory infection, unspecified: Secondary | ICD-10-CM | POA: Diagnosis not present

## 2023-10-24 DIAGNOSIS — Z79899 Other long term (current) drug therapy: Secondary | ICD-10-CM | POA: Insufficient documentation

## 2023-10-24 DIAGNOSIS — E119 Type 2 diabetes mellitus without complications: Secondary | ICD-10-CM | POA: Insufficient documentation

## 2023-10-24 DIAGNOSIS — I10 Essential (primary) hypertension: Secondary | ICD-10-CM | POA: Insufficient documentation

## 2023-10-24 DIAGNOSIS — R059 Cough, unspecified: Secondary | ICD-10-CM | POA: Diagnosis present

## 2023-10-24 LAB — RESP PANEL BY RT-PCR (RSV, FLU A&B, COVID)  RVPGX2
Influenza A by PCR: NEGATIVE
Influenza B by PCR: NEGATIVE
Resp Syncytial Virus by PCR: NEGATIVE
SARS Coronavirus 2 by RT PCR: NEGATIVE

## 2023-10-24 MED ORDER — ACETAMINOPHEN 325 MG PO TABS: 650.0000 mg | ORAL_TABLET | Freq: Once | ORAL | Status: DC

## 2023-10-24 MED ORDER — ACETAMINOPHEN 500 MG PO TABS
1000.0000 mg | ORAL_TABLET | Freq: Once | ORAL | Status: AC
  Administered 2023-10-24: 1000 mg via ORAL
  Filled 2023-10-24: qty 2

## 2023-10-24 MED ORDER — ACETAMINOPHEN 500 MG PO TABS: 500.0000 mg | ORAL_TABLET | Freq: Four times a day (QID) | ORAL | 0 refills | Status: AC | PRN

## 2023-10-24 MED ORDER — NAPROXEN 500 MG PO TABS: 500.0000 mg | ORAL_TABLET | Freq: Once | ORAL | Status: DC

## 2023-10-24 MED ORDER — OXYMETAZOLINE HCL 0.05 % NA SOLN
1.0000 | Freq: Once | NASAL | Status: AC
  Administered 2023-10-24: 1 via NASAL
  Filled 2023-10-24: qty 30

## 2023-10-24 MED ORDER — OXYMETAZOLINE HCL 0.05 % NA SOLN: 2.0000 | Freq: Two times a day (BID) | NASAL | 0 refills | Status: AC

## 2023-10-24 MED ORDER — GUAIFENESIN 100 MG/5ML PO LIQD
5.0000 mL | Freq: Once | ORAL | Status: AC
  Administered 2023-10-24: 5 mL via ORAL
  Filled 2023-10-24: qty 10

## 2023-10-24 MED ORDER — GUAIFENESIN 100 MG/5ML PO LIQD: 5.0000 mL | Freq: Four times a day (QID) | ORAL | 0 refills | Status: AC | PRN

## 2023-10-24 NOTE — Discharge Instructions (Addendum)
 As discussed, your negative for COVID, flu, and RSV. The chest x-ray does not show pneumonia or bronchitis. Your symptoms are most likely due to another type of viral illness. I have sent prescriptions of afrin, robitussin, and tylenol  to the pharmacy. Take afrin for no more than 3 days for congestion. You can also get medications like Sudafed and Mucinex  over the counter for congestion. Take Robitussin as needed for cough. Take tylenol  every 6 hours as needed for fevers and body aches.  Make sure you're drinking plenty of fluids. Drink fluids with electrolytes like gatorade or pedialyte.  Follow-up with your primary care provider in a week for reevaluation of your symptoms.  Get help right away if: You have shortness of breath that gets worse. You have very bad or constant: Headache. Ear pain. Pain in your forehead, behind your eyes, and over your cheekbones (sinus pain). Chest pain. You have long-lasting (chronic) lung disease along with any of these: Making high-pitched whistling sounds when you breathe, most often when you breathe out (wheezing). Long-lasting cough (more than 14 days). Coughing up blood. A change in your usual mucus. You have a stiff neck. You have changes in your: Vision. Hearing. Thinking. Mood.

## 2023-10-24 NOTE — ED Triage Notes (Signed)
 Non-productive cough and body aches, bilateral ear pain for 3-4 days. Pt has back pain when coughing.

## 2023-10-24 NOTE — ED Provider Notes (Signed)
 [place] [facility]MENT AT [facility] Provider Note   CSN: [id number] Arrival date & time: 10/24/23  1512     History  Chief Complaint  Patient presents with   Cough    BARRI NEIDLINGER is a 58 y.o. female with a history of diabetes mellitus, hypertension, and neuropathy presents the ED today for cough.  Patient is a family member at bedside who is acting as nurse, learning disability.  She states that patient has been having cough, congestion, body aches, and headaches for the past 5 days.  She has tried Tylenol  with minimal relief. She states that she has pain at her ribs from coughing. No chest pain, shortness of breath, or wheezing. Denies any fevers, vomiting, diarrhea, or abdominal pain. No additional complaints or concerns at this time.    Home Medications Prior to Admission medications   Medication Sig Start Date End Date Taking? Authorizing Provider  acetaminophen  (TYLENOL ) 500 MG tablet Take 1 tablet (500 mg total) by mouth every 6 (six) hours as needed for up to 14 days. 10/24/23 11/07/23 Yes Waddell Sluder, PA-C  guaiFENesin  (ROBITUSSIN) 100 MG/5ML liquid Take 5 mLs by mouth every 6 (six) hours as needed for up to 7 days for cough or to loosen phlegm. 10/24/23 10/31/23 Yes Waddell Sluder, PA-C  oxymetazoline  (AFRIN) 0.05 % nasal spray Place 2 sprays into both nostrils 2 (two) times daily for 3 days. 10/24/23 10/27/23 Yes Waddell Sluder, PA-C  albuterol (PROVENTIL HFA;VENTOLIN HFA) 108 (90 Base) MCG/ACT inhaler Inhale into the lungs every 6 (six) hours as needed for wheezing or shortness of breath.    [provider]  atorvastatin (LIPITOR) 20 MG tablet Take 20 mg by mouth daily.    [provider]  cyclobenzaprine  (FLEXERIL ) 10 MG tablet Take 1 tablet (10 mg total) by mouth 3 (three) times daily as needed for muscle spasms. 12/19/17   Corey, Evan S, MD  diclofenac  sodium (VOLTAREN ) 1 % GEL Apply 4 g topically 4 (four) times daily. To affected joint. 08/06/18   Corey,  Evan S, MD  Fluticasone-Salmeterol (ADVAIR) 250-50 MCG/DOSE AEPB Inhale 1 puff into the lungs 2 (two) times daily.    [provider]  gabapentin (NEURONTIN) 300 MG capsule Take 300 mg by mouth 3 (three) times daily.    [provider]  HYDROcodone -acetaminophen  (NORCO/VICODIN) 5-325 MG tablet Take 1-2 tablets by mouth every 6 (six) hours as needed. 10/31/22   Hildegard, Amjad, PA-C  lisinopril (PRINIVIL,ZESTRIL) 20 MG tablet Take 20 mg by mouth daily.    [provider]  meloxicam (MOBIC) 15 MG tablet Take 15 mg by mouth daily.    [provider]  metFORMIN (GLUCOPHAGE) 500 MG tablet Take by mouth 2 (two) times daily with a meal.    [provider]  metoCLOPramide  (REGLAN ) 10 MG tablet Take 1 tablet (10 mg total) by mouth every 6 (six) hours as needed for nausea. 10/09/23   Trine Raynell Moder, MD  metoprolol tartrate (LOPRESSOR) 25 MG tablet Take 25 mg by mouth 2 (two) times daily.    [provider]  omeprazole (PRILOSEC) 40 MG capsule TK ONE C PO ONCE D B A MEAL 12/10/17   [provider]  ondansetron  (ZOFRAN -ODT) 4 MG disintegrating tablet Take 1 tablet (4 mg total) by mouth every 8 (eight) hours as needed for nausea or vomiting. 10/31/22   Hildegard, Amjad, PA-C  predniSONE  (STERAPRED UNI-PAK 21 TAB) 10 MG (21) TBPK tablet Take by mouth daily. Take 6 tabs by  mouth daily  for 2 days, then 5 tabs for 2 days, then 4 tabs for 2 days, then 3 tabs for 2 days, 2 tabs for 2 days, then 1 tab by mouth daily for 2 days 10/31/22   Hildegard Loge, PA-C      Allergies    Asa [aspirin], Bactrim [sulfamethoxazole-trimethoprim], Pork-derived products, Fish allergy , and Sulfamethoxazole-trimethoprim    Review of Systems   Review of Systems  Respiratory:  Positive for cough.   All other systems reviewed and are negative.   Physical Exam Updated Vital Signs BP 139/85 (BP Location: Right Arm)   Pulse (!) 58   Temp 98 F (36.7 C) (Oral)   Resp 19   Ht 5' 5  (1.651 m)   Wt 93 kg   SpO2 96%   BMI 34.12 kg/m  Physical Exam Vitals and nursing note reviewed.  Constitutional:      Appearance: Normal appearance. She is ill-appearing.  HENT:     Head: Normocephalic and atraumatic.     Mouth/Throat:     Mouth: Mucous membranes are moist.  Eyes:     Conjunctiva/sclera: Conjunctivae normal.     Pupils: Pupils are equal, round, and reactive to light.  Cardiovascular:     Rate and Rhythm: Normal rate and regular rhythm.     Pulses: Normal pulses.     Heart sounds: Normal heart sounds.  Pulmonary:     Effort: Pulmonary effort is normal.     Breath sounds: Normal breath sounds.  Abdominal:     Palpations: Abdomen is soft.     Tenderness: There is no abdominal tenderness.  Musculoskeletal:     Cervical back: Normal range of motion.  Skin:    General: Skin is warm and dry.     Findings: No rash.  Neurological:     General: No focal deficit present.     Mental Status: She is alert.  Psychiatric:        Mood and Affect: Mood normal.        Behavior: Behavior normal.    ED Results / Procedures / Treatments   Labs (all labs ordered are listed, but only abnormal results are displayed) Labs Reviewed  RESP PANEL BY RT-PCR (RSV, FLU A&B, COVID)  [id number]    EKG None  Radiology DG Chest 2 View Result Date: 10/24/2023 CLINICAL DATA:  Cough body ache EXAM: CHEST - 2 VIEW COMPARISON:  CT 10/31/2022 FINDINGS: Mild cardiomegaly. No acute airspace disease or effusion. No pneumothorax. IMPRESSION: No active cardiopulmonary disease. Mild cardiomegaly. Electronically Signed   By: Luke Bun M.D.   On: 10/24/2023 21:22    Procedures Procedures: not indicated.   Medications Ordered in ED Medications  guaiFENesin  (ROBITUSSIN) 100 MG/5ML liquid 5 mL (has no administration in time range)  oxymetazoline  (AFRIN) 0.05 % nasal spray 1 spray (has no administration in time range)  acetaminophen  (TYLENOL ) tablet 1,000 mg (has no administration in time  range)    ED Course/ Medical Decision Making/ A&P                                 Medical Decision Making Amount and/or Complexity of Data Reviewed Radiology: ordered.  Risk OTC drugs.   This patient presents to the ED for concern of cough, this involves an extensive number of treatment options, and is a complaint that carries with it a high risk of complications and morbidity.   Differential diagnosis  includes: COVID, RSV, flu, pneumonia, bronchitis, other URI, etc.   Comorbidities  See HPI above   Additional History  Additional history obtained from prior records.   Lab Tests  I ordered and personally interpreted labs.  The pertinent results include:   Negative respiratory panel   Imaging Studies  I ordered imaging studies including CXR  I independently visualized and interpreted imaging which showed: no active cardiopulmonary disease. I agree with the radiologist interpretation   Problem List / ED Course / Critical Interventions / Medication Management  Cough and body aches x5 days. She has been taking tylenol  with some improvement. No fevers, N/V/D, chest pain, or shortness of breath. I ordered medications including: Tylenol  for headache Robitussin for cough Afrin for congestion  Medications given prior to discharge. I have reviewed the patients home medicines and have made adjustments as needed   Social Determinants of Health  Access to healthcare   Test / Admission - Considered  Discussed findings with patient. She is stable and safe for discharge home. Return precautions given.        Final Clinical Impression(s) / ED Diagnoses Final diagnoses:  Viral URI with cough    Rx / DC Orders ED Discharge Orders          Ordered    guaiFENesin  (ROBITUSSIN) 100 MG/5ML liquid  Every 6 hours PRN        10/24/23 2155    oxymetazoline  (AFRIN) 0.05 % nasal spray  2 times daily        10/24/23 2155    acetaminophen  (TYLENOL ) 500 MG tablet   Every 6 hours PRN        10/24/23 2155              Waddell Sluder, PA-C 10/25/23 0015    Doretha Folks, MD 10/30/23 [phone]
